# Patient Record
Sex: Male | Born: 1935
Health system: Southern US, Community
[De-identification: ages and names within clinical notes are randomized; demographics above are authoritative.]

## PROBLEM LIST (undated history)

## (undated) DIAGNOSIS — E785 Hyperlipidemia, unspecified: Secondary | ICD-10-CM

## (undated) DIAGNOSIS — I2510 Atherosclerotic heart disease of native coronary artery without angina pectoris: Secondary | ICD-10-CM

## (undated) DIAGNOSIS — I509 Heart failure, unspecified: Secondary | ICD-10-CM

## (undated) DIAGNOSIS — I714 Abdominal aortic aneurysm, without rupture, unspecified: Secondary | ICD-10-CM

## (undated) DIAGNOSIS — Z951 Presence of aortocoronary bypass graft: Secondary | ICD-10-CM

## (undated) DIAGNOSIS — I110 Hypertensive heart disease with heart failure: Principal | ICD-10-CM

## (undated) DIAGNOSIS — R351 Nocturia: Secondary | ICD-10-CM

## (undated) DIAGNOSIS — I429 Cardiomyopathy, unspecified: Secondary | ICD-10-CM

## (undated) DIAGNOSIS — D638 Anemia in other chronic diseases classified elsewhere: Secondary | ICD-10-CM

## (undated) DIAGNOSIS — I10 Essential (primary) hypertension: Secondary | ICD-10-CM

## (undated) DIAGNOSIS — I350 Nonrheumatic aortic (valve) stenosis: Secondary | ICD-10-CM

## (undated) DIAGNOSIS — N184 Chronic kidney disease, stage 4 (severe): Secondary | ICD-10-CM

## (undated) DIAGNOSIS — I519 Heart disease, unspecified: Secondary | ICD-10-CM

## (undated) DIAGNOSIS — N400 Enlarged prostate without lower urinary tract symptoms: Secondary | ICD-10-CM

## (undated) DIAGNOSIS — N3941 Urge incontinence: Secondary | ICD-10-CM

## (undated) DIAGNOSIS — N2581 Secondary hyperparathyroidism of renal origin: Secondary | ICD-10-CM

## (undated) DIAGNOSIS — T82330D Leakage of aortic (bifurcation) graft (replacement), subsequent encounter: Secondary | ICD-10-CM

## (undated) HISTORY — DX: Essential (primary) hypertension: I10

## (undated) HISTORY — DX: Presence of aortocoronary bypass graft: Z95.1

## (undated) HISTORY — DX: Hyperlipidemia, unspecified: E78.5

## (undated) HISTORY — DX: Nocturia: R35.1

## (undated) HISTORY — DX: Nonrheumatic aortic (valve) stenosis: I35.0

## (undated) HISTORY — DX: Atherosclerotic heart disease of native coronary artery without angina pectoris: I25.10

## (undated) HISTORY — DX: Heart failure, unspecified: I50.9

## (undated) HISTORY — DX: Benign prostatic hyperplasia without lower urinary tract symptoms: N40.0

## (undated) HISTORY — DX: Leakage of aortic (bifurcation) graft (replacement), subsequent encounter: T82.330D

## (undated) HISTORY — DX: Abdominal aortic aneurysm, without rupture: I71.4

## (undated) HISTORY — DX: Cardiomyopathy, unspecified: I42.9

## (undated) HISTORY — DX: Secondary hyperparathyroidism of renal origin: N25.81

## (undated) HISTORY — DX: Abdominal aortic aneurysm, without rupture, unspecified: I71.40

## (undated) HISTORY — DX: Chronic kidney disease, stage 4 (severe): N18.4

## (undated) HISTORY — PX: CARDIAC CATHETERIZATION: SHX172

## (undated) HISTORY — PX: CORONARY ARTERY BYPASS GRAFT: SHX141

## (undated) HISTORY — PX: BACK SURGERY: SHX140

## (undated) HISTORY — DX: Hypertensive heart disease with heart failure: I11.0

## (undated) HISTORY — PX: REPLACEMENT TOTAL KNEE BILATERAL: SUR1225

## (undated) HISTORY — DX: Urge incontinence: N39.41

## (undated) HISTORY — DX: Anemia in other chronic diseases classified elsewhere: D63.8

## (undated) HISTORY — DX: Heart disease, unspecified: I51.9

---

## 1998-10-07 HISTORY — PX: REPLACEMENT TOTAL KNEE BILATERAL: SUR1225

## 2000-10-07 HISTORY — PX: CORONARY ARTERY BYPASS GRAFT: SHX141

## 2004-10-07 HISTORY — PX: BACK SURGERY: SHX140

## 2005-07-25 ENCOUNTER — Inpatient Hospital Stay (HOSPITAL_COMMUNITY): Admission: RE | Admit: 2005-07-25 | Discharge: 2005-08-01 | Payer: Self-pay | Admitting: Neurosurgery

## 2005-07-29 ENCOUNTER — Ambulatory Visit: Payer: Self-pay | Admitting: Internal Medicine

## 2005-08-23 ENCOUNTER — Ambulatory Visit (HOSPITAL_COMMUNITY): Admission: RE | Admit: 2005-08-23 | Discharge: 2005-08-23 | Payer: Self-pay | Admitting: Neurosurgery

## 2006-08-20 IMAGING — CR DG CHEST 2V
2 series · 2 of 2 positions shown · non-contrast
Comparison: 07/23/05.

CLINICAL DATA: Stenosis.  Cough.  
 CHEST ? 2 VIEW:

[w chest pa]
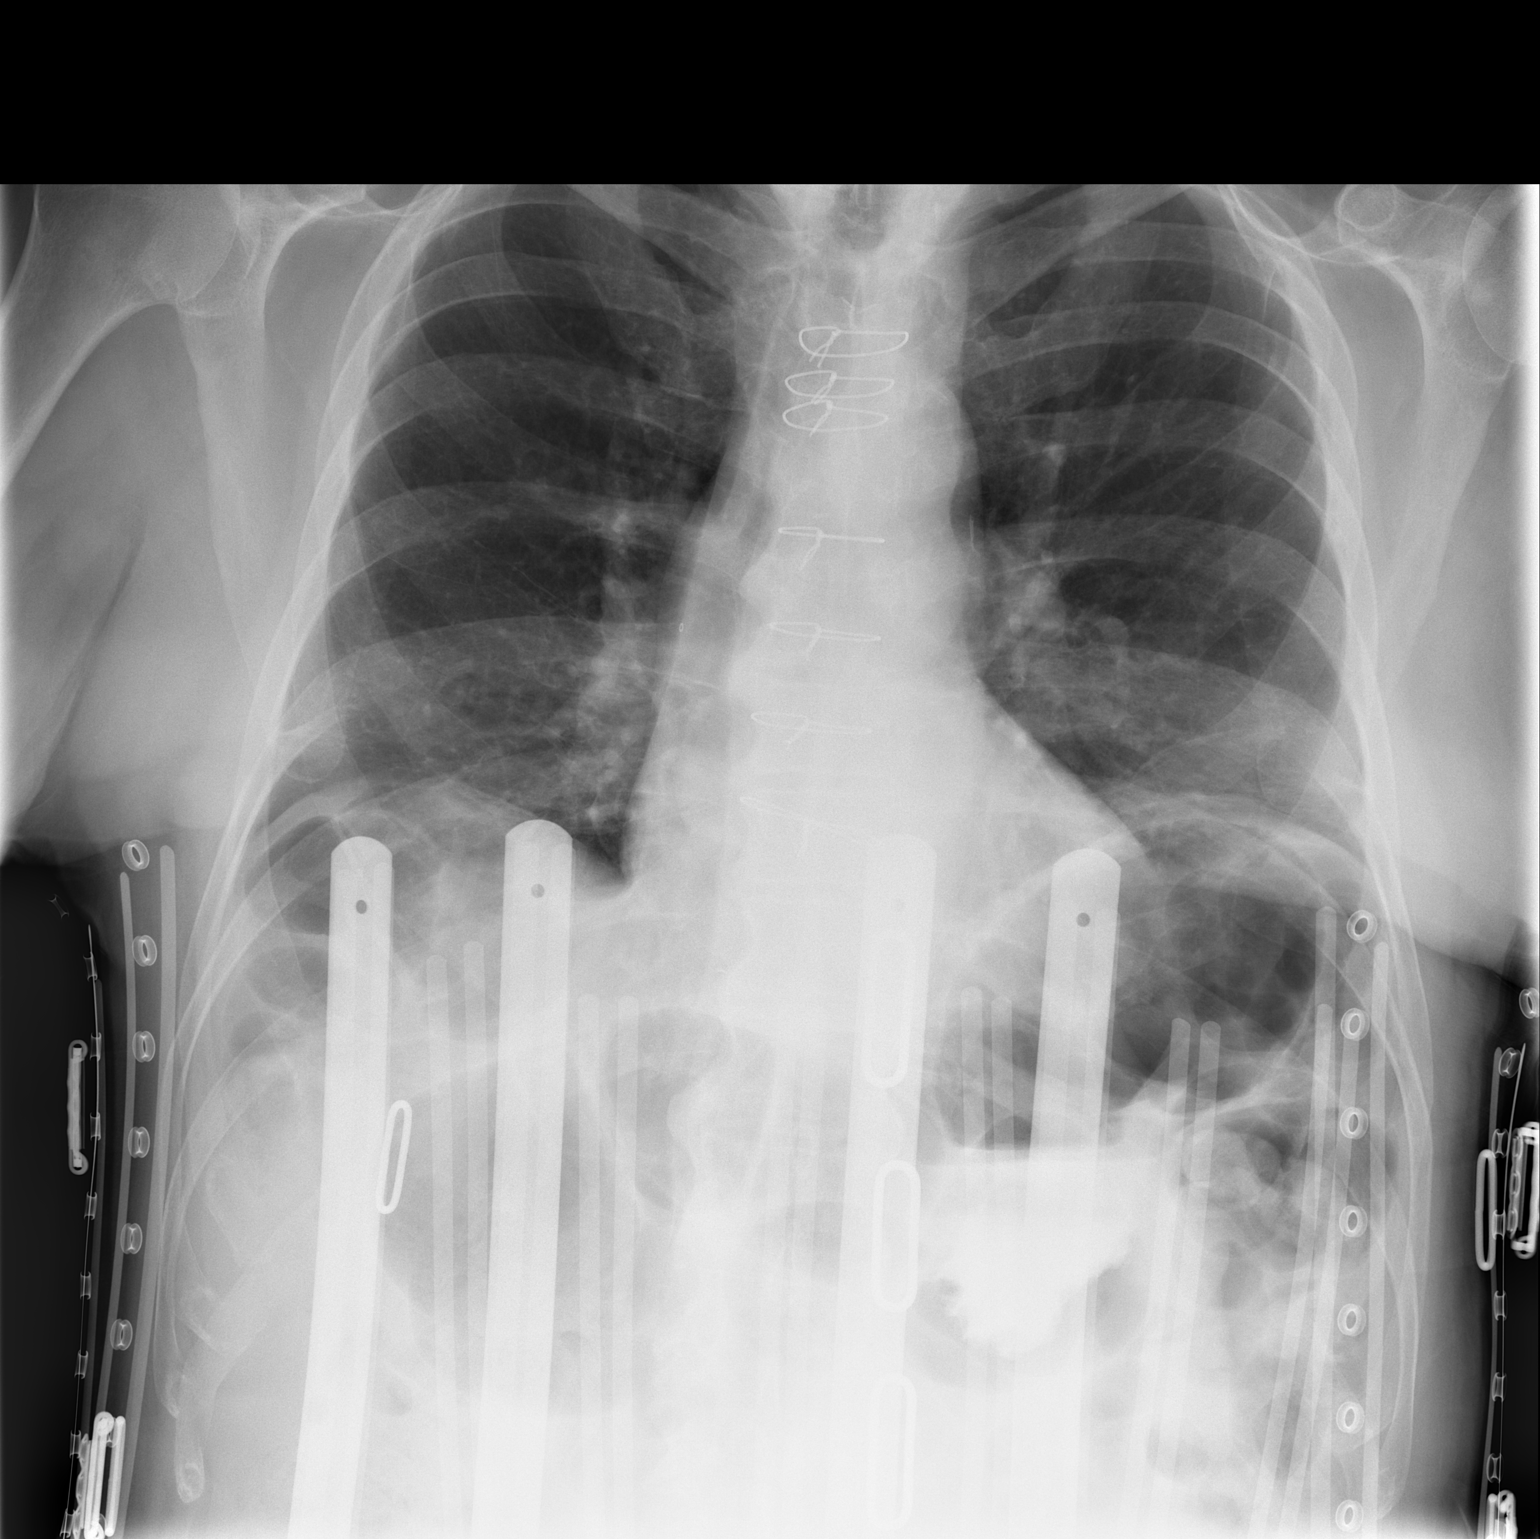

[w chest lat]
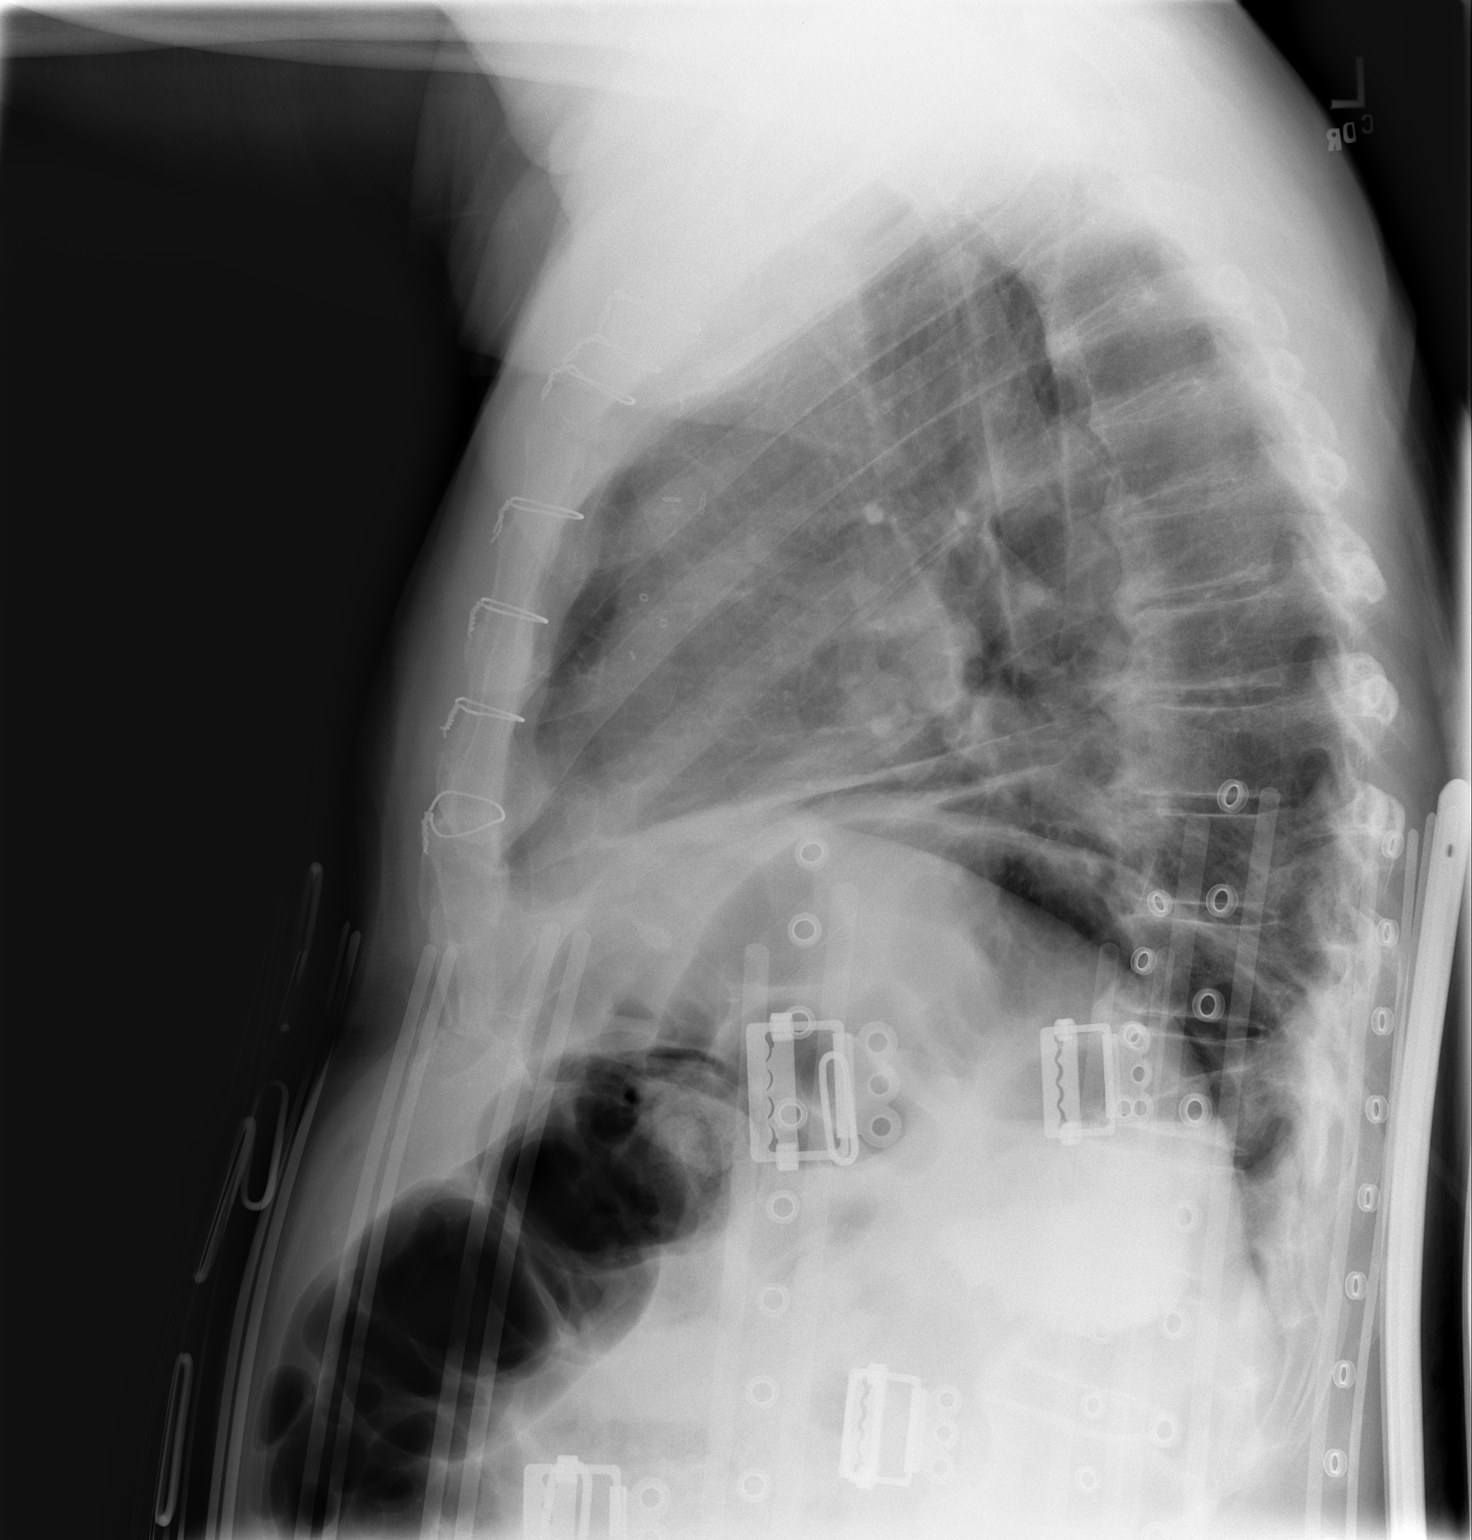

[2 of 2 positions shown; findings below may reference images not displayed]

FINDINGS: Peribronchial thickening in the lower lung zones compatible with chronic changes.  Mild subsegmental atelectasis may be present at the lung bases.  No infiltrative-like density.  Unremarkable cardiomediastinal silhouette.  Sternal wire sutures.
IMPRESSION: Chronic bronchitic-like markings at the bases in addition to possible mild subsegmental atelectasis.

## 2007-10-08 HISTORY — PX: REPLACEMENT TOTAL KNEE: SUR1224

## 2007-11-02 ENCOUNTER — Inpatient Hospital Stay (HOSPITAL_COMMUNITY): Admission: RE | Admit: 2007-11-02 | Discharge: 2007-11-06 | Payer: Self-pay | Admitting: Orthopedic Surgery

## 2007-11-04 ENCOUNTER — Encounter (INDEPENDENT_AMBULATORY_CARE_PROVIDER_SITE_OTHER): Payer: Self-pay | Admitting: Orthopedic Surgery

## 2007-11-04 ENCOUNTER — Ambulatory Visit: Payer: Self-pay | Admitting: Vascular Surgery

## 2010-10-07 HISTORY — PX: ARTERIAL ANEURYSM REPAIR: SHX556

## 2011-01-23 ENCOUNTER — Other Ambulatory Visit: Payer: Self-pay | Admitting: Oncology

## 2011-01-23 ENCOUNTER — Encounter (HOSPITAL_BASED_OUTPATIENT_CLINIC_OR_DEPARTMENT_OTHER): Payer: Medicare Other | Admitting: Oncology

## 2011-01-23 ENCOUNTER — Ambulatory Visit (HOSPITAL_COMMUNITY)
Admission: RE | Admit: 2011-01-23 | Discharge: 2011-01-23 | Disposition: A | Discharge status: Home / Self Care | Payer: Medicare Other | Source: Ambulatory Visit | Attending: Oncology | Admitting: Oncology

## 2011-01-23 DIAGNOSIS — Z96659 Presence of unspecified artificial knee joint: Secondary | ICD-10-CM | POA: Insufficient documentation

## 2011-01-23 DIAGNOSIS — E785 Hyperlipidemia, unspecified: Secondary | ICD-10-CM

## 2011-01-23 DIAGNOSIS — D472 Monoclonal gammopathy: Secondary | ICD-10-CM

## 2011-01-23 DIAGNOSIS — Z0389 Encounter for observation for other suspected diseases and conditions ruled out: Secondary | ICD-10-CM | POA: Insufficient documentation

## 2011-01-23 DIAGNOSIS — C9000 Multiple myeloma not having achieved remission: Secondary | ICD-10-CM

## 2011-01-23 DIAGNOSIS — I2510 Atherosclerotic heart disease of native coronary artery without angina pectoris: Secondary | ICD-10-CM

## 2011-01-23 DIAGNOSIS — Z981 Arthrodesis status: Secondary | ICD-10-CM | POA: Insufficient documentation

## 2011-01-23 DIAGNOSIS — N400 Enlarged prostate without lower urinary tract symptoms: Secondary | ICD-10-CM

## 2011-01-23 LAB — CBC WITH DIFFERENTIAL/PLATELET
BASO%: 0.3 % (ref 0.0–2.0)
Basophils Absolute: 0 10*3/uL (ref 0.0–0.1)
EOS%: 1.4 % (ref 0.0–7.0)
HCT: 38.1 % — ABNORMAL LOW (ref 38.4–49.9)
HGB: 12.9 g/dL — ABNORMAL LOW (ref 13.0–17.1)
LYMPH%: 18.4 % (ref 14.0–49.0)
MCH: 36.2 pg — ABNORMAL HIGH (ref 27.2–33.4)
MCHC: 33.9 g/dL (ref 32.0–36.0)
MCV: 106.7 fL — ABNORMAL HIGH (ref 79.3–98.0)
MONO#: 1.4 10*3/uL — ABNORMAL HIGH (ref 0.1–0.9)
NEUT#: 4.7 10*3/uL (ref 1.5–6.5)
NEUT%: 61.5 % (ref 39.0–75.0)
Platelets: 173 10*3/uL (ref 140–400)
RBC: 3.57 10*6/uL — ABNORMAL LOW (ref 4.20–5.82)
RDW: 13.9 % (ref 11.0–14.6)
WBC: 7.6 10*3/uL (ref 4.0–10.3)
lymph#: 1.4 10*3/uL (ref 0.9–3.3)

## 2011-01-25 LAB — COMPREHENSIVE METABOLIC PANEL
ALT: 10 U/L (ref 0–53)
AST: 23 U/L (ref 0–37)
Albumin: 3.8 g/dL (ref 3.5–5.2)
Alkaline Phosphatase: 48 U/L (ref 39–117)
BUN: 20 mg/dL (ref 6–23)
Calcium: 9.2 mg/dL (ref 8.4–10.5)
Chloride: 106 mEq/L (ref 96–112)
Creatinine, Ser: 1.44 mg/dL (ref 0.40–1.50)
Potassium: 4.2 mEq/L (ref 3.5–5.3)
Sodium: 133 mEq/L — ABNORMAL LOW (ref 135–145)

## 2011-01-25 LAB — KAPPA/LAMBDA LIGHT CHAINS
Kappa:Lambda Ratio: 0.99 (ref 0.26–1.65)
Lambda Free Lght Chn: 2.49 mg/dL (ref 0.57–2.63)

## 2011-01-25 LAB — SPEP & IFE WITH QIG
Albumin ELP: 55.6 % — ABNORMAL LOW (ref 55.8–66.1)
Alpha-1-Globulin: 5 % — ABNORMAL HIGH (ref 2.9–4.9)
Alpha-2-Globulin: 12.2 % — ABNORMAL HIGH (ref 7.1–11.8)
Gamma Globulin: 15 % (ref 11.1–18.8)
IgA: 346 mg/dL (ref 68–378)
IgG (Immunoglobin G), Serum: 926 mg/dL (ref 694–1618)
IgM, Serum: 32 mg/dL — ABNORMAL LOW (ref 60–263)
Total Protein, Serum Electrophoresis: 6.3 g/dL (ref 6.0–8.3)

## 2011-02-19 NOTE — Op Note (Signed)
NAME:  Harry Klein, Harry Klein NO.:  [id number]   MEDICAL RECORD NO.:  [id number]          PATIENT TYPE:  INP   LOCATION:  5037                         FACILITY:  [facility]   PHYSICIAN:  Estill Bamberg. Ronnie Derby, M.D. DATE OF BIRTH:  Feb 18, 1922   DATE OF PROCEDURE:  11/05/2007  DATE OF DISCHARGE:                               OPERATIVE REPORT   SURGEON:  Estill Bamberg. Ronnie Derby, M.D.   ASSISTANTAaron Edelman D. Petrarca, P.A.-C.   ANESTHESIA:  General.   PREOPERATIVE DIAGNOSIS:  Left knee osteoarthritis.   POSTOPERATIVE DIAGNOSIS:  Left knee osteoarthritis.   PROCEDURE:  Left total knee arthroplasty.   INDICATIONS FOR PROCEDURE:  The patient is an 77 year old white male  with failure of conservative measures for osteoarthritis of the left  knee.  Informed consent was obtained.   DESCRIPTION OF PROCEDURE:  The patient was placed supine and  administered general anesthesia. The left leg was prepped and draped in  the usual sterile fashion.  A straight incision was made with a #10  blade approximately 6-7 inches in length.  I used fresh 10 blade to make  a medial parapatellar arthrotomy to perform a synovectomy.  I then  elevated the deep MCL off of the medial crest of the tibia.  This was a  varus knee.  I did have to do some releasing of the MCL.  I everted the  patella and measured 26 mm thick.  I reamed down 9 mm and used a 35-mm  template to drill the lug holes.  The prosthetic trial in place  recreated the 25-mm thickness.  I then went into flexion.  I used  extramedullary alignment system on the tibia to make a perpendicular cut  to the anatomic axis of the tibia.  Used the intramedullary drill guide  on the femur and used the intramedullary guide set on 6 degree valgus  and made the distal femoral cut with a sagittal saw.  I then marked out  the epicondylar axis.  Posterior condylar angle measured 3 degrees.  I  then sized to size F pinned through the 3 degree external rotation  holes.  At this point, I placed a lamina spreader in the kneed and  removed the medial and lateral menisci, posterior condylar osteophytes  and ACL and PCL.  I then placed the 10- and 12-mm spacer block in the  knee and actually ended up going up to a 14 until I achieved balance.  This was because I released some of the medial ligaments.  I then  finished the femur with a size F finishing block, tibia with size 6  tibial tray, drilled the keep.  I then trialed a size F femur, size 6  tibia, size 14 insert, size 35 patella, had good flexion/extension gap  balance dropping the angle back to 125 degrees.  I then removed the  trial components and copiously irrigated.  I then cemented in all the  components and removed excess cement and allowed the cement to harden  with the leg in extension.  I left a Hemovac coming out superolaterally  and deep to the arthrotomy, pain catheter coming out supermedial and  superficial to the arthrotomy.  I obtained hemostasis after the  tourniquet was let down and copiously irrigated once again.  I then  closed the arthrotomy with figure-of-eight #1 Vicryl sutures.  After the  arthrotomy closure, closed deep soft tissues with buried 0 Vicryl  sutures, subcuticular 2-0 Vicryl running stitch, and skin staples.  Dressed with Xeroform dressing sponges, sterile Webril and TED stocking.   COMPLICATIONS:  None.   DRAINS:  One Hemovac, one pain catheter.   ESTIMATED BLOOD LOSS:  300 mL.           ______________________________  Estill Bamberg. Ronnie Derby, M.D.     SDL/MEDQ  D:  11/05/2007  T:  11/06/2007  Job:  [id number]

## 2011-02-22 NOTE — Discharge Summary (Signed)
NAME:  Harry Klein, Harry Klein NO.:  [id number]   MEDICAL RECORD NO.:  [id number]          PATIENT TYPE:  INP   LOCATION:  3002                         FACILITY:  [facility]   PHYSICIAN:  Ophelia Charter, M.D.DATE OF BIRTH:  1922-07-18   DATE OF ADMISSION:  07/25/2005  DATE OF DISCHARGE:  08/01/2005                                 DISCHARGE SUMMARY   BRIEF HISTORY:  The patient is an 77 year old white male who suffered from  severe back and leg numbness and weakness to the point where he could barely  ambulate. He was worked up on a lumbar MRI which demonstrated severe spinal  stenosis of L4-5 with moderate spinal stenosis of L3-4 and L5-S1.  I  discussed the risks of treatment options with the patient including surgery.  The patient has weighed the risks, benefits, and alternatives of surgery and  decided to proceed with decompressive laminectomy as well as an L4-5 fusion.   For further details of this admission, please refer to typed history and  physical.   HOSPITAL COURSE:  I admitted the patient to [facility] on July 25, 2005, and on the day of admission I performed a lumbar decompression and  fusion. The surgery went well (for full details of this operation please  refer to operative note).   POSTOPERATIVE COURSE:  The patient was initially monitored in the ICU  because of his advanced age. He did well and was transferred to the  neurosurgical general floor. On postoperative day #1, the patient did have  some urinary retention and was treated with I&O catheterization and Flomax.  The patient's main complaint, however, was that he had a lot of trouble  swallowing. Because of this, we had anesthesiologist come back and see the  patient. They made some recommendations. We had the speech therapist see the  patient as well and they did some therapy. The patient continued to have  trouble swallowing and we eventually had GI see the patient. He was seen by  Dr. Carlean Purl. His dysphagia was attributed to pharyngeal trauma secondary to  intubation and was assumed it would improve with time, which it did. The  patient was kept n.p.o. for some time and eventually had his swallowing  evaluation redone which demonstrated that he could begin eating a dysphagia  II diet with nectar thin liquids. By August 01, 2005, the patient was  afebrile, his vital signs were stable, he was swallowing adequately, and he  was requesting discharge. We therefore discharged him home.   DISCHARGE INSTRUCTIONS:  The patient was given discharge instructions,  instructed to follow up with me in 3-4 weeks, instructed on his diet by the  speech therapist.   DISCHARGE PRESCRIPTIONS:  1.  Tylox, #50, one to two p.o. q.4h. p.r.n. pain.  2.  Valium 2 mg, #50, one p.o. q.6h. muscle spasms.   FINAL DIAGNOSES:  1.  L3-4, L4-5, and L5-S1 degenerative disk disease.  2.  Spinal stenosis.  3.  Lumbar radiculopathy.  4.  Lumbago.  5.  Urinary retention.  6.  Dysphagia.  7.  Gastroesophageal  reflux disease.  8.  Benign prostatic hypertrophy.  9.  Hypercholesterolemia.  10. First-degree AV block.   PROCEDURE PERFORMED:  Decompressive laminectomy L4-5 with bilateral  laminotomies at L3; L4-5 posterior lumbar interbody fusion; placement of  bilateral L4-5 interbody prosthesis (Capstone PEEK cages); posterior  nonsegmental instrumentation with Legacy titanium pedicle screws and rods;  L4-5 posterolateral arthrodesis with local morcellized autograft bone and  VITOSS bone graft extender.      Ophelia Charter, M.D.  Electronically Signed     JDJ/MEDQ  D:  09/19/2005  T:  09/22/2005  Job:  [id number]

## 2011-02-22 NOTE — Consult Note (Signed)
NAME:  Harry Klein, Harry Klein NO.:  [id number]   MEDICAL RECORD NO.:  [id number]          PATIENT TYPE:  INP   LOCATION:  3002                         FACILITY:  [facility]   PHYSICIAN:  Gatha Mayer, M.D. LHCDATE OF BIRTH:  03/16/1922   DATE OF CONSULTATION:  07/29/2005  DATE OF DISCHARGE:                                   CONSULTATION   REASON FOR CONSULTATION:  Dysphagia.   ASSESSMENT:  Dysphagia.  Acute onset after endotracheal intubation.  It is  temporally associated with that.  He is tender in his neck and pharyngeal  area and has pain there as well when he swallows, and his voice is hoarse.  I think he has had some sort of trauma from his endotracheal intubation with  swelling.  The cricopharyngeus is spastic on a modified barium swallow.   PLAN:  Neck CT.  MRI would be better, but he has had fresh metal in his  lumbar spine (he had lumbar stenosis surgery with laminectomy,  decompression, and spinal fusion on July 25, 2005).  Further plans  pending that.  May need steroids, may need ENT evaluation.   HISTORY OF PRESENT ILLNESS:  The patient is a pleasant 77 year old white man  who had chronic low back pain.  He underwent surgery, as described above, on  July 25, 2005.  Almost since waking up, he has been sore in his throat,  having pain with trying to swallow and has been unable to swallow basically.  Prior to that, he had some pill dysphagia but never had problems like this  before.  Speech pathology study demonstrated a prominent cricopharyngeus  muscle that was spastic apparently and kicked back the food and liquid  bolus.  His GI history is notable for a history of GI bleeding.  In July of  2006, he had EGD, colonoscopy, and a capsule endoscopy by Dr. Lyndel Safe in  [place], and I think it was thought that he had hemorrhoids causing  bleeding.  A week prior to admission, he was started on iron for some  anemia.  Records on the chart show his iron  saturation was 14, ferritin was  14 as well on July 15, 2005.  Dr. Lin Landsman at [facility]  is his primary care physician.  While here, his hemoglobin was 9.9.  His  hemoglobin was 10.6 on July 11, 2005.   ALLERGIES:  NO KNOWN DRUG ALLERGIES.   MEDICATIONS:  1.  Tenormin.  2.  Proscar.  3.  Psyllium.  4.  Colace.  5.  Niacin.  6.  Zocor.  7.  Iron sulfate.  8.  Protonix 40 mg daily.  9.  Flomax.   PAST MEDICAL HISTORY:  1.  Lumbar stenosis.  2.  Benign prostatic hypertrophy.  3.  Anemia thought to be due to hemorrhoids as best we know.  4.  Coronary artery disease with history of previous stent, coronary artery      bypass grafting in 2002 by Dr. Alyse Low in [place].  5.  History of facial skin cancer.  6.  Status post right total  knee replacement in 2000.  7.  Dyslipidemia.  8.  Reflux disease.  9.  Atelectasis on preoperative chest x-ray.   SOCIAL HISTORY:  His daughter works for hospice in [place].  He  lives with his wife in [place].  No tobacco or alcohol.   FAMILY HISTORY:  No colon cancer.   REVIEW OF SYSTEMS:  Back pain, postoperative at this point.  Inner ear  problems and dizziness at times.  Chronic rash on abdominal wall.  All other  systems appear negative at this time other than those things mentioned  above.   PHYSICAL EXAMINATION:  GENERAL:  Elderly white man in no acute distress at  this time.  Pleasant.  VITAL SIGNS:  Temperature 98.4, pulse 68, respirations 18, blood pressure  120/72.  HEENT:  Eyes anicteric.  Mouth free of lesions.  I do not see the uvula  well.  The tongue looks normal.  NECK:  Supple.  He is tender over the neck.  Bilateral in the submandibular  area, the thyroid cartilage is palpable, but I do not detect a mass.  There  is some fullness, left greater than right, in the neck.  LUNGS:  Crackles at the bases, improved with respiration.  HEART:  S1 and S2.  No murmurs, rubs, or gallops.  ABDOMEN:  Soft,  nontender.  No organomegaly or mass.  EXTREMITIES:  No edema.  NEUROLOGIC:  Appropriate, alert and oriented x3.   LABORATORY DATA:  As above.  BMET looks normal.   I appreciate the opportunity to care for this patient.      Gatha Mayer, M.D. [facility]  Electronically Signed     CEG/MEDQ  D:  07/29/2005  T:  07/30/2005  Job:  [id number]   cc:   Ophelia Charter, M.D.  Fax: [phone]   Jackquline Denmark  FaxYF:[phone]   Lovette Cliche II, M.D.  Fax: [phone]

## 2011-02-22 NOTE — Op Note (Signed)
NAME:  Harry Klein, Harry Klein                ACCOUNT NO.:  [id number]   MEDICAL RECORD NO.:  [id number]          PATIENT TYPE:  INP   LOCATION:  2899                         FACILITY:  [facility]   PHYSICIAN:  Ophelia Charter, M.D.DATE OF BIRTH:  [birth year]   DATE OF PROCEDURE:  07/25/2005  DATE OF DISCHARGE:                                 OPERATIVE REPORT   PREOPERATIVE DIAGNOSIS:  L3-4, 4-5 and 5-1 degenerative disk disease, spinal  stenosis, lumbar radiculopathy with lumbago.   POSTOPERATIVE DIAGNOSIS:  L3-4, 4-5 and 5-1 degenerative disk disease,  spinal stenosis, lumbar radiculopathy with lumbago.   OPERATION PERFORMED:  Decompressive laminectomy at L4 and L5 with bilateral  laminotomies at L3; L4-5 posterior lumbar interbody fusion, placement of  bilateral interbody prostheses (Capstone PEEK cages), posterior nonsegmental  instrumentation with Legacy titanium pedicle screws and rods and L4-5  posterolateral arthrodesis with local morcellized autograft and Vitoss bone  graft extender.   SURGEON:  Ophelia Charter, M.D.   ASSISTANT:  Hosie Spangle, M.D.   ANESTHESIA:  General endotracheal.   ESTIMATED BLOOD LOSS:  200 mL.   SPECIMENS:  None.   DRAINS:  None.   COMPLICATIONS:  None.   INDICATIONS FOR PROCEDURE:  The patient is an 77 year old white male who has  suffered from severe back and leg pain numbness and weakness to the point  where he can barely ambulate.  He was worked with a lumbar MRI which  demonstrated severe spinal stenosis at L4-5 with moderate stenosis at L3-4  and 5-1.  I discussed the various treatment options with the patient  including surgery.  The patient has weighed the risks, benefits and  alternatives to surgery and decided to proceed with a decompressive  laminectomy as well as a 4-5 fusion.   DESCRIPTION OF PROCEDURE:  The patient was brought to the operating room by  the anesthesia team.  General endotracheal anesthesia was induced.  The  patient was then turned to the prone position on the Wilson frame.  His  lumbosacral region was then shaved and prepared with Betadine scrub and  Betadine solution and sterile drapes were applied.  I then injected the area  to be incised with Marcaine with epinephrine solution.  I used a scalpel to  make a linear midline incision over the L3-4, 4-5 and 5-1 interspaces.  I  used electrocautery to perform a subperiosteal dissection exposing the  bilateral spinous processes and laminae of L3, 4, 5, and S1.  We obtained an  intraoperative radiograph to confirm our location.  I inserted the  Versatract retractor for exposure.  We began by incising the interspinous  ligament at L3-4, 4-5 and 5-1.  We used Leksell rongeur to remove the  spinous process of L4 and 5.  We saved this bone and later cleared it of  soft tissue and morcellized it to be used in the fusion process.  We  performed bilateral laminotomies at L3, 4, and 5.  We completed the  laminectomy at L4 and 5 using Kerrison punches and removed the ligamentum  flavum at L5-S1 and 4-5 and L3-4  widening the laminotomies at L3 as well.  We performed foraminotomies about the bilateral L4, 5, and S1 nerve roots.  There was severe facet arthropathy at L5-S1 on the right and severe stenosis  at L4-5 as well.  This completed the decompression.   We now turned our attention to the arthrodesis.  We incised the L4-5  intervertebral disk and then performed aggressive diskectomy from the left  side.  We removed the left L4-5 facet and this gave [place] good lateral  transforaminal exposure.  We then cleared off all soft tissue from the L4-5  disk space and then we obtained a Capstone PEEK cage which measured 8 x 31  mm.  It was filled with local autograft gone and Vitoss.  We then inserted  it at the L4-5 interspace and turned it side ways and impacted it anteriorly  ie we performed a TLIF.  We of course retracted the neural structures out of  harm's way  prior to placing this cage. We then filled posteriorly to the  cage with local autograft bone and Vitoss completing the posterior lumbar  interbody fusion.   We now turned attention to instrumentation.  Under fluoroscopic guidance we  cannulated the bilateral L4 and L5 pedicles with a bone probe.  We tapped  the pedicles with a 5.5 tap and placed a 6.5 x 55 mm pedicle screw  bilaterally at L4 and 6.5 x 55 screwon the other side at L5.  We did on the  right side the we did fracture the pedicle in placing the pedicle screw, and  we removed the fractured part of the pedicle medially to make more room for  the exiting L5 nerve root.  After doing this, there was no compression of  the L5 nerve root.  We then connected the unilateral pedicle screws with a  lordotic rod.  We secured the rod in place with the caps.  We now turned  attention to posterolateral arthrodesis.  We used a high speed drill to  decorticate the remainder of the right L4-5 facet joint and laid a  combination of autograft bone and Vitoss over the decorticated  posterolateral structures completing the posterolateral arthrodesis.   We obtained stringent hemostasis using bipolar electrocautery.  We inspected  the thecal sac and the bilateral L4, 5 and S1 nerve roots and noted that  they were well decompressed all the way up through the neural foramina.  We  then copiously irrigated the wound out with bacitracin solution.  I then  removed the solution, removed the Versatract retractor and then  reapproximated the patient's thoracolumbar fascia with interrupted #1 Vicryl  sutures, subcutaneous tissue with interrupted 2-0 Vicryl suture and the skin  with Steri-Strips and benzoin. The wound was then coated with bacitracin  ointment, sterile dressing was applied, the drapes were removed.  The  patient was subsequently returned to supine position where he was extubated by the anesthesia team and transported to the post anesthesia  care unit in  stable condition.  All sponge, needle and instrument counts were correct at  the end of the case.      Ophelia Charter, M.D.  Electronically Signed     JDJ/MEDQ  D:  07/26/2005  T:  07/26/2005  Job:  [id number]

## 2011-02-22 NOTE — Discharge Summary (Signed)
NAME:  OTHELLO, ANDRADA NO.:  [id number]   MEDICAL RECORD NO.:  [id number]          PATIENT TYPE:  INP   LOCATION:  5037                         FACILITY:  [facility]   PHYSICIAN:  Estill Bamberg. Ronnie Derby, M.D. DATE OF BIRTH:  11/10/1921   DATE OF ADMISSION:  11/02/2007  DATE OF DISCHARGE:  11/06/2007                               DISCHARGE SUMMARY   ADMISSION DIAGNOSIS:  Osteoarthritis left knee.   DISCHARGE DIAGNOSES:  1. Osteoarthritis left knee.  2. History of coronary artery disease/myocardial infarction/coronary      artery bypass grafting.  3. Abdominal aortic aneurysm.  4. Acute blood-loss anemia.  5. Heparin-induced thrombocytopenia.  6. Hyperosmolality.   PROCEDURE:  Left total knee arthroplasty.   HISTORY:  Harry Klein is an 77 year old male with intermittent left  knee pain, that of a severe, sharp pain associated with weakness.  Pain  is worsening and affecting his ADLs.  He denies any nighttime pain.  Using Darvocet for pain.  Radiographic end-stage OA of the left knee.  Indicated for left total knee arthroplasty.   HOSPITAL COURSE:  This 77 year old male admitted November 02, 2007.  After appropriate laboratory studies were obtained and 1 gram Ancef IV  on-call to the operating room he was taken to the operating room where  he underwent a left total knee arthroplasty.  He tolerated the procedure  well.  PCA Dilaudid pump in a reduced dose was started.  He was  continued on Ancef 1 gram IV q.6h. x3 doses.  Celebrex 400 mg p.o. in  PACU and then 200 b.i.d. was begun.  Lovenox 30 mg subcu q.12h. was  started on January 27 at 8 a.m.  Consults to PT, OT and care management  were made.  CPM 0-90 degrees for 6-8 hours per day.  He was allowed to  weight-bear as tolerated.  He was allowed out of bed to chair the  following day.  He was weaned off of his O2.  Speech therapy was  consulted for swallow study at the request of the family.  On January 28  his  dressing was changed, his Foley was discontinued.  Heparin-induced  thrombocytopenia screen was ordered and was noted to be positive.  He  was then switched to Arixtra.  Dopplers were ordered of his left lower  leg to rule out DVT.  B12 and folate levels were ordered also.  On  January 29 he was typed and crossed for 2 units of packed cells.  His  Arixtra was held on January 29.  All stools were guaiaced.  He was  placed on Trinsicon one p.o. b.i.d.  On January 30 he was transfused one  more unit of packed cells and he was then discharged after the unit to  home.  Doppler studies showed no obvious evidence of DVT, superficial  thrombosis, or Baker's cyst.  Laboratory studies with hemoglobin of  14.0; hematocrit 40.8%; white count 8300; platelets 172,000.  His  platelets dropped to 101,000.  Discharge hemoglobin 8.4; hematocrit  4.1%; white count 9.6; and platelets were 115,000.  His MCV was 102.4.  Pro time preoperatively 13.7, INR of 1.0, PT 29.  Heparin-induced  platelet aggregation study was negative.  However, the HIT rapid screen  was positive.  HIT antibody detection ELISA was negative.  Preoperative  sodium 139, potassium 4.4, chloride 104, CO2 29, glucose 99, BUN 15,  creatinine 1.36.  He had sodium down to 132 and his potassium 3.4 4  during his hospital stay.  Creatinine rose to a maximum of 2.05.  Discharge sodium 138, potassium 3.6, chloride 108, CO2 25, glucose 107,  BUN 26, creatinine 1.55.  GFR preoperatively was 50.  GFR at discharge  was 43.  Preoperative total protein 6.7, albumin 3.9, AST 20, ALT 10,  ALP 60, total bilirubin 0.6.  B12 was 392.  Folate was 1066 - quite  elevated.  Urinalysis was benign for a voided urine.  Blood type was O  negative, antibody screen negative.  Transfused 3 units of packed cells  during his hospital course.  Urine culture showed no growth.   DISCHARGE INSTRUCTIONS:  He will follow blue instruction sheet.  Change  his dressing daily.   There will be no restrictions on his diet.  Increase his activity as tolerated.  Use his walker and ambulate  weightbearing as tolerated.  May shower Friday.  No lifting or driving  for 6 weeks.  CPM 0-90 degrees 6-8 hours per day.   Prescriptions for:  1. Percocet 5/325 one to two tablets every 4 hours as needed for pain.  2. Robaxin 500 mg one tablet every 6 hours as needed for spasms.  3. Arixtra 2.5 mg subcu injected as instructed at 8 a.m. daily.  Last      dose November 13, 2007, and then begin aspirin 81 mg on November 14, 2007.  4. Take an iron supplement two times a day for 1 month.   Discharged in improved condition.      Mike Craze Petrarca, P.A.-C.    ______________________________  Estill Bamberg. Ronnie Derby, M.D.    BDP/MEDQ  D:  12/04/2007  T:  12/05/2007  Job:  [id number]

## 2011-06-27 LAB — BASIC METABOLIC PANEL
BUN: 25 — ABNORMAL HIGH
CO2: 25
Calcium: 8.2 — ABNORMAL LOW
Calcium: 8.3 — ABNORMAL LOW
Chloride: 100
Chloride: 100
Creatinine, Ser: 2.05 — ABNORMAL HIGH
GFR calc Af Amer: 52 — ABNORMAL LOW
GFR calc Af Amer: >60
GFR calc non Af Amer: 42 — ABNORMAL LOW
GFR calc non Af Amer: 43 — ABNORMAL LOW
GFR calc non Af Amer: 55 — ABNORMAL LOW
Glucose, Bld: 107 — ABNORMAL HIGH
Glucose, Bld: 128 — ABNORMAL HIGH
Glucose, Bld: 131 — ABNORMAL HIGH
Glucose, Bld: 135 — ABNORMAL HIGH
Glucose, Bld: 138 — ABNORMAL HIGH
Potassium: 3.6
Potassium: 3.8
Potassium: 4
Sodium: 133 — ABNORMAL LOW
Sodium: 136
Sodium: 138

## 2011-06-27 LAB — CROSSMATCH
ABO/RH(D): O NEG
ABO/RH(D): O NEG
Antibody Screen: NEGATIVE
Antibody Screen: NEGATIVE

## 2011-06-27 LAB — COMPREHENSIVE METABOLIC PANEL
ALT: 10
AST: 20
Albumin: 3.9
Alkaline Phosphatase: 60
BUN: 15
CO2: 29
Calcium: 9.4
Chloride: 104
Creatinine, Ser: 1.36
GFR calc Af Amer: >60
GFR calc non Af Amer: 50 — ABNORMAL LOW
Glucose, Bld: 99
Potassium: 4.4
Sodium: 139
Total Bilirubin: 0.6
Total Protein: 6.7

## 2011-06-27 LAB — PROTIME-INR
INR: 1
Prothrombin Time: 13.7

## 2011-06-27 LAB — HEPARIN INDUCED PLATELET AGGREGATION (CONVERTED LAB)
Heparin 0.1 Donor: 4
Heparin 1 Patient: 4
Heparin 100 Donor: 1
Heparin 100 Patient: 2

## 2011-06-27 LAB — DIFFERENTIAL
Basophils Absolute: 0
Basophils Relative: 0
Eosinophils Absolute: 0.1
Eosinophils Relative: 2
Lymphocytes Relative: 29
Lymphs Abs: 2.4
Monocytes Absolute: 1.3 — ABNORMAL HIGH
Monocytes Relative: 16 — ABNORMAL HIGH
Neutro Abs: 4.3
Neutrophils Relative %: 53

## 2011-06-27 LAB — CBC
HCT: 24.1 — ABNORMAL LOW
HCT: 26.2 — ABNORMAL LOW
HCT: 26.3 — ABNORMAL LOW
HCT: 40.8
Hemoglobin: 10.6 — ABNORMAL LOW
Hemoglobin: 14
Hemoglobin: 7.3 — CL
Hemoglobin: 8.4 — ABNORMAL LOW
Hemoglobin: 9 — ABNORMAL LOW
MCHC: 34.3
MCHC: 34.5
MCV: 105.3 — ABNORMAL HIGH
MCV: 105.8 — ABNORMAL HIGH
Platelets: 114 — ABNORMAL LOW
Platelets: 172
RBC: 2.36 — ABNORMAL LOW
RBC: 2.94 — ABNORMAL LOW
RBC: 3.88 — ABNORMAL LOW
RDW: 13.1
RDW: 13.1
RDW: 13.2
RDW: 13.4
RDW: 13.4
RDW: 15.2
WBC: 13.6 — ABNORMAL HIGH
WBC: 8.3
WBC: 9.6

## 2011-06-27 LAB — URINALYSIS, ROUTINE W REFLEX MICROSCOPIC
Bilirubin Urine: NEGATIVE
Glucose, UA: NEGATIVE
Hgb urine dipstick: NEGATIVE
Ketones, ur: NEGATIVE
Nitrite: NEGATIVE
Protein, ur: NEGATIVE
Specific Gravity, Urine: 1.012
Urobilinogen, UA: 0.2
pH: 7

## 2011-06-27 LAB — ABO/RH: ABO/RH(D): O NEG

## 2011-06-27 LAB — APTT: aPTT: 29

## 2011-10-09 DIAGNOSIS — K219 Gastro-esophageal reflux disease without esophagitis: Secondary | ICD-10-CM | POA: Diagnosis not present

## 2011-10-17 DIAGNOSIS — N401 Enlarged prostate with lower urinary tract symptoms: Secondary | ICD-10-CM | POA: Diagnosis not present

## 2011-10-17 DIAGNOSIS — I714 Abdominal aortic aneurysm, without rupture: Secondary | ICD-10-CM | POA: Diagnosis not present

## 2011-10-17 DIAGNOSIS — I2510 Atherosclerotic heart disease of native coronary artery without angina pectoris: Secondary | ICD-10-CM | POA: Diagnosis not present

## 2011-10-17 DIAGNOSIS — R609 Edema, unspecified: Secondary | ICD-10-CM | POA: Diagnosis not present

## 2011-11-25 DIAGNOSIS — N2581 Secondary hyperparathyroidism of renal origin: Secondary | ICD-10-CM | POA: Diagnosis not present

## 2011-11-25 DIAGNOSIS — I129 Hypertensive chronic kidney disease with stage 1 through stage 4 chronic kidney disease, or unspecified chronic kidney disease: Secondary | ICD-10-CM | POA: Diagnosis not present

## 2011-11-25 DIAGNOSIS — R609 Edema, unspecified: Secondary | ICD-10-CM | POA: Diagnosis not present

## 2011-12-05 DIAGNOSIS — H903 Sensorineural hearing loss, bilateral: Secondary | ICD-10-CM | POA: Diagnosis not present

## 2011-12-23 DIAGNOSIS — M25559 Pain in unspecified hip: Secondary | ICD-10-CM | POA: Diagnosis not present

## 2011-12-23 DIAGNOSIS — Z23 Encounter for immunization: Secondary | ICD-10-CM | POA: Diagnosis not present

## 2012-01-09 DIAGNOSIS — K5900 Constipation, unspecified: Secondary | ICD-10-CM | POA: Diagnosis not present

## 2012-02-07 DIAGNOSIS — C44320 Squamous cell carcinoma of skin of unspecified parts of face: Secondary | ICD-10-CM | POA: Diagnosis not present

## 2012-02-07 DIAGNOSIS — L570 Actinic keratosis: Secondary | ICD-10-CM | POA: Diagnosis not present

## 2012-03-04 DIAGNOSIS — I719 Aortic aneurysm of unspecified site, without rupture: Secondary | ICD-10-CM | POA: Diagnosis not present

## 2012-03-12 DIAGNOSIS — I714 Abdominal aortic aneurysm, without rupture: Secondary | ICD-10-CM | POA: Diagnosis not present

## 2012-04-02 DIAGNOSIS — H40019 Open angle with borderline findings, low risk, unspecified eye: Secondary | ICD-10-CM | POA: Diagnosis not present

## 2012-05-19 DIAGNOSIS — N2581 Secondary hyperparathyroidism of renal origin: Secondary | ICD-10-CM | POA: Diagnosis not present

## 2012-05-26 DIAGNOSIS — I129 Hypertensive chronic kidney disease with stage 1 through stage 4 chronic kidney disease, or unspecified chronic kidney disease: Secondary | ICD-10-CM | POA: Diagnosis not present

## 2012-05-26 DIAGNOSIS — D649 Anemia, unspecified: Secondary | ICD-10-CM | POA: Diagnosis not present

## 2012-05-26 DIAGNOSIS — N2581 Secondary hyperparathyroidism of renal origin: Secondary | ICD-10-CM | POA: Diagnosis not present

## 2012-05-28 DIAGNOSIS — I2510 Atherosclerotic heart disease of native coronary artery without angina pectoris: Secondary | ICD-10-CM | POA: Diagnosis not present

## 2012-05-28 DIAGNOSIS — R609 Edema, unspecified: Secondary | ICD-10-CM | POA: Diagnosis not present

## 2012-05-28 DIAGNOSIS — I714 Abdominal aortic aneurysm, without rupture: Secondary | ICD-10-CM | POA: Diagnosis not present

## 2012-05-28 DIAGNOSIS — N401 Enlarged prostate with lower urinary tract symptoms: Secondary | ICD-10-CM | POA: Diagnosis not present

## 2012-06-02 DIAGNOSIS — E785 Hyperlipidemia, unspecified: Secondary | ICD-10-CM | POA: Diagnosis not present

## 2012-06-02 DIAGNOSIS — I2510 Atherosclerotic heart disease of native coronary artery without angina pectoris: Secondary | ICD-10-CM | POA: Diagnosis not present

## 2012-06-25 DIAGNOSIS — Z23 Encounter for immunization: Secondary | ICD-10-CM | POA: Diagnosis not present

## 2012-07-02 DIAGNOSIS — I714 Abdominal aortic aneurysm, without rupture: Secondary | ICD-10-CM | POA: Diagnosis not present

## 2012-07-02 DIAGNOSIS — I739 Peripheral vascular disease, unspecified: Secondary | ICD-10-CM | POA: Diagnosis not present

## 2012-07-23 DIAGNOSIS — N401 Enlarged prostate with lower urinary tract symptoms: Secondary | ICD-10-CM | POA: Diagnosis not present

## 2012-07-23 DIAGNOSIS — N139 Obstructive and reflux uropathy, unspecified: Secondary | ICD-10-CM | POA: Diagnosis not present

## 2012-10-23 DIAGNOSIS — J069 Acute upper respiratory infection, unspecified: Secondary | ICD-10-CM | POA: Diagnosis not present

## 2012-11-12 DIAGNOSIS — K219 Gastro-esophageal reflux disease without esophagitis: Secondary | ICD-10-CM | POA: Diagnosis not present

## 2012-11-16 DIAGNOSIS — D649 Anemia, unspecified: Secondary | ICD-10-CM | POA: Diagnosis not present

## 2012-11-16 DIAGNOSIS — N2581 Secondary hyperparathyroidism of renal origin: Secondary | ICD-10-CM | POA: Diagnosis not present

## 2012-11-17 DIAGNOSIS — H40019 Open angle with borderline findings, low risk, unspecified eye: Secondary | ICD-10-CM | POA: Diagnosis not present

## 2012-11-24 DIAGNOSIS — N2581 Secondary hyperparathyroidism of renal origin: Secondary | ICD-10-CM | POA: Diagnosis not present

## 2012-11-24 DIAGNOSIS — D649 Anemia, unspecified: Secondary | ICD-10-CM | POA: Diagnosis not present

## 2012-11-24 DIAGNOSIS — I129 Hypertensive chronic kidney disease with stage 1 through stage 4 chronic kidney disease, or unspecified chronic kidney disease: Secondary | ICD-10-CM | POA: Diagnosis not present

## 2012-12-03 DIAGNOSIS — I714 Abdominal aortic aneurysm, without rupture: Secondary | ICD-10-CM | POA: Diagnosis not present

## 2012-12-03 DIAGNOSIS — E7800 Pure hypercholesterolemia, unspecified: Secondary | ICD-10-CM | POA: Diagnosis not present

## 2012-12-03 DIAGNOSIS — I2510 Atherosclerotic heart disease of native coronary artery without angina pectoris: Secondary | ICD-10-CM | POA: Diagnosis not present

## 2012-12-03 DIAGNOSIS — N401 Enlarged prostate with lower urinary tract symptoms: Secondary | ICD-10-CM | POA: Diagnosis not present

## 2012-12-03 DIAGNOSIS — N139 Obstructive and reflux uropathy, unspecified: Secondary | ICD-10-CM | POA: Diagnosis not present

## 2012-12-16 DIAGNOSIS — I2510 Atherosclerotic heart disease of native coronary artery without angina pectoris: Secondary | ICD-10-CM | POA: Diagnosis not present

## 2012-12-16 DIAGNOSIS — E7800 Pure hypercholesterolemia, unspecified: Secondary | ICD-10-CM | POA: Diagnosis not present

## 2012-12-16 DIAGNOSIS — IMO0002 Reserved for concepts with insufficient information to code with codable children: Secondary | ICD-10-CM | POA: Diagnosis not present

## 2012-12-31 DIAGNOSIS — I719 Aortic aneurysm of unspecified site, without rupture: Secondary | ICD-10-CM | POA: Diagnosis not present

## 2012-12-31 DIAGNOSIS — I714 Abdominal aortic aneurysm, without rupture: Secondary | ICD-10-CM | POA: Diagnosis not present

## 2012-12-31 DIAGNOSIS — R609 Edema, unspecified: Secondary | ICD-10-CM | POA: Diagnosis not present

## 2012-12-31 DIAGNOSIS — E7800 Pure hypercholesterolemia, unspecified: Secondary | ICD-10-CM | POA: Diagnosis not present

## 2013-01-27 DIAGNOSIS — T82898A Other specified complication of vascular prosthetic devices, implants and grafts, initial encounter: Secondary | ICD-10-CM | POA: Diagnosis not present

## 2013-03-31 DIAGNOSIS — M940 Chondrocostal junction syndrome [Tietze]: Secondary | ICD-10-CM | POA: Diagnosis not present

## 2013-03-31 DIAGNOSIS — R079 Chest pain, unspecified: Secondary | ICD-10-CM | POA: Diagnosis not present

## 2013-04-28 DIAGNOSIS — I714 Abdominal aortic aneurysm, without rupture: Secondary | ICD-10-CM | POA: Diagnosis not present

## 2013-04-28 DIAGNOSIS — N401 Enlarged prostate with lower urinary tract symptoms: Secondary | ICD-10-CM | POA: Diagnosis not present

## 2013-04-28 DIAGNOSIS — E7800 Pure hypercholesterolemia, unspecified: Secondary | ICD-10-CM | POA: Diagnosis not present

## 2013-05-17 DIAGNOSIS — H40019 Open angle with borderline findings, low risk, unspecified eye: Secondary | ICD-10-CM | POA: Diagnosis not present

## 2013-05-25 DIAGNOSIS — D649 Anemia, unspecified: Secondary | ICD-10-CM | POA: Diagnosis not present

## 2013-05-27 DIAGNOSIS — G8929 Other chronic pain: Secondary | ICD-10-CM | POA: Diagnosis not present

## 2013-05-27 DIAGNOSIS — L570 Actinic keratosis: Secondary | ICD-10-CM | POA: Diagnosis not present

## 2013-05-27 DIAGNOSIS — R52 Pain, unspecified: Secondary | ICD-10-CM | POA: Diagnosis not present

## 2013-05-27 DIAGNOSIS — M545 Low back pain: Secondary | ICD-10-CM | POA: Diagnosis not present

## 2013-06-02 DIAGNOSIS — M545 Low back pain: Secondary | ICD-10-CM | POA: Diagnosis not present

## 2013-06-04 DIAGNOSIS — M545 Low back pain: Secondary | ICD-10-CM | POA: Diagnosis not present

## 2013-06-04 DIAGNOSIS — D649 Anemia, unspecified: Secondary | ICD-10-CM | POA: Diagnosis not present

## 2013-06-04 DIAGNOSIS — N2581 Secondary hyperparathyroidism of renal origin: Secondary | ICD-10-CM | POA: Diagnosis not present

## 2013-06-04 DIAGNOSIS — I129 Hypertensive chronic kidney disease with stage 1 through stage 4 chronic kidney disease, or unspecified chronic kidney disease: Secondary | ICD-10-CM | POA: Diagnosis not present

## 2013-06-08 DIAGNOSIS — L821 Other seborrheic keratosis: Secondary | ICD-10-CM | POA: Diagnosis not present

## 2013-06-08 DIAGNOSIS — L578 Other skin changes due to chronic exposure to nonionizing radiation: Secondary | ICD-10-CM | POA: Diagnosis not present

## 2013-06-08 DIAGNOSIS — L570 Actinic keratosis: Secondary | ICD-10-CM | POA: Diagnosis not present

## 2013-06-08 DIAGNOSIS — M545 Low back pain: Secondary | ICD-10-CM | POA: Diagnosis not present

## 2013-06-10 DIAGNOSIS — M545 Low back pain: Secondary | ICD-10-CM | POA: Diagnosis not present

## 2013-06-15 DIAGNOSIS — M545 Low back pain: Secondary | ICD-10-CM | POA: Diagnosis not present

## 2013-06-17 DIAGNOSIS — M545 Low back pain: Secondary | ICD-10-CM | POA: Diagnosis not present

## 2013-06-17 DIAGNOSIS — Z23 Encounter for immunization: Secondary | ICD-10-CM | POA: Diagnosis not present

## 2013-06-28 DIAGNOSIS — I714 Abdominal aortic aneurysm, without rupture: Secondary | ICD-10-CM | POA: Diagnosis not present

## 2013-07-12 DIAGNOSIS — R5381 Other malaise: Secondary | ICD-10-CM | POA: Diagnosis not present

## 2013-07-12 DIAGNOSIS — M6740 Ganglion, unspecified site: Secondary | ICD-10-CM | POA: Diagnosis not present

## 2013-07-12 DIAGNOSIS — Z0000 Encounter for general adult medical examination without abnormal findings: Secondary | ICD-10-CM | POA: Diagnosis not present

## 2013-07-12 DIAGNOSIS — D539 Nutritional anemia, unspecified: Secondary | ICD-10-CM | POA: Diagnosis not present

## 2013-07-12 DIAGNOSIS — E7800 Pure hypercholesterolemia, unspecified: Secondary | ICD-10-CM | POA: Diagnosis not present

## 2013-07-12 DIAGNOSIS — Z79899 Other long term (current) drug therapy: Secondary | ICD-10-CM | POA: Diagnosis not present

## 2013-07-12 DIAGNOSIS — M1990 Unspecified osteoarthritis, unspecified site: Secondary | ICD-10-CM | POA: Diagnosis not present

## 2013-07-26 DIAGNOSIS — R351 Nocturia: Secondary | ICD-10-CM | POA: Diagnosis not present

## 2013-07-26 DIAGNOSIS — N401 Enlarged prostate with lower urinary tract symptoms: Secondary | ICD-10-CM | POA: Diagnosis not present

## 2013-07-26 DIAGNOSIS — B356 Tinea cruris: Secondary | ICD-10-CM | POA: Diagnosis not present

## 2013-07-28 DIAGNOSIS — Z79899 Other long term (current) drug therapy: Secondary | ICD-10-CM | POA: Diagnosis not present

## 2013-09-18 DIAGNOSIS — K5900 Constipation, unspecified: Secondary | ICD-10-CM | POA: Diagnosis not present

## 2013-09-18 DIAGNOSIS — R1084 Generalized abdominal pain: Secondary | ICD-10-CM | POA: Diagnosis not present

## 2013-09-18 DIAGNOSIS — E7800 Pure hypercholesterolemia, unspecified: Secondary | ICD-10-CM | POA: Diagnosis not present

## 2013-09-18 DIAGNOSIS — Z79899 Other long term (current) drug therapy: Secondary | ICD-10-CM | POA: Diagnosis not present

## 2013-09-18 DIAGNOSIS — N281 Cyst of kidney, acquired: Secondary | ICD-10-CM | POA: Diagnosis not present

## 2013-09-18 DIAGNOSIS — I10 Essential (primary) hypertension: Secondary | ICD-10-CM | POA: Diagnosis not present

## 2013-10-27 DIAGNOSIS — I714 Abdominal aortic aneurysm, without rupture, unspecified: Secondary | ICD-10-CM | POA: Diagnosis not present

## 2013-10-27 DIAGNOSIS — Z79899 Other long term (current) drug therapy: Secondary | ICD-10-CM | POA: Diagnosis not present

## 2013-10-27 DIAGNOSIS — I2510 Atherosclerotic heart disease of native coronary artery without angina pectoris: Secondary | ICD-10-CM | POA: Diagnosis not present

## 2013-10-27 DIAGNOSIS — E7800 Pure hypercholesterolemia, unspecified: Secondary | ICD-10-CM | POA: Diagnosis not present

## 2013-11-17 DIAGNOSIS — H40019 Open angle with borderline findings, low risk, unspecified eye: Secondary | ICD-10-CM | POA: Diagnosis not present

## 2013-11-18 DIAGNOSIS — M1990 Unspecified osteoarthritis, unspecified site: Secondary | ICD-10-CM | POA: Diagnosis not present

## 2013-11-18 DIAGNOSIS — K5900 Constipation, unspecified: Secondary | ICD-10-CM | POA: Diagnosis not present

## 2013-12-07 DIAGNOSIS — K219 Gastro-esophageal reflux disease without esophagitis: Secondary | ICD-10-CM | POA: Diagnosis not present

## 2013-12-07 DIAGNOSIS — K5900 Constipation, unspecified: Secondary | ICD-10-CM | POA: Diagnosis not present

## 2013-12-08 DIAGNOSIS — H26499 Other secondary cataract, unspecified eye: Secondary | ICD-10-CM | POA: Diagnosis not present

## 2013-12-08 DIAGNOSIS — I2510 Atherosclerotic heart disease of native coronary artery without angina pectoris: Secondary | ICD-10-CM | POA: Diagnosis not present

## 2013-12-15 DIAGNOSIS — L538 Other specified erythematous conditions: Secondary | ICD-10-CM | POA: Diagnosis not present

## 2013-12-15 DIAGNOSIS — L259 Unspecified contact dermatitis, unspecified cause: Secondary | ICD-10-CM | POA: Diagnosis not present

## 2013-12-15 DIAGNOSIS — L570 Actinic keratosis: Secondary | ICD-10-CM | POA: Diagnosis not present

## 2013-12-16 DIAGNOSIS — D638 Anemia in other chronic diseases classified elsewhere: Secondary | ICD-10-CM | POA: Diagnosis not present

## 2013-12-16 DIAGNOSIS — N2581 Secondary hyperparathyroidism of renal origin: Secondary | ICD-10-CM | POA: Diagnosis not present

## 2013-12-16 DIAGNOSIS — N183 Chronic kidney disease, stage 3 unspecified: Secondary | ICD-10-CM | POA: Diagnosis not present

## 2013-12-16 DIAGNOSIS — I10 Essential (primary) hypertension: Secondary | ICD-10-CM | POA: Diagnosis not present

## 2013-12-30 DIAGNOSIS — N183 Chronic kidney disease, stage 3 unspecified: Secondary | ICD-10-CM | POA: Diagnosis not present

## 2014-02-10 DIAGNOSIS — Z0000 Encounter for general adult medical examination without abnormal findings: Secondary | ICD-10-CM | POA: Diagnosis not present

## 2014-02-10 DIAGNOSIS — Z79899 Other long term (current) drug therapy: Secondary | ICD-10-CM | POA: Diagnosis not present

## 2014-02-18 DIAGNOSIS — R609 Edema, unspecified: Secondary | ICD-10-CM | POA: Diagnosis not present

## 2014-02-24 DIAGNOSIS — I5022 Chronic systolic (congestive) heart failure: Secondary | ICD-10-CM | POA: Diagnosis not present

## 2014-02-24 DIAGNOSIS — Z79899 Other long term (current) drug therapy: Secondary | ICD-10-CM | POA: Diagnosis not present

## 2014-02-24 DIAGNOSIS — R609 Edema, unspecified: Secondary | ICD-10-CM | POA: Diagnosis not present

## 2014-02-24 DIAGNOSIS — I714 Abdominal aortic aneurysm, without rupture, unspecified: Secondary | ICD-10-CM | POA: Diagnosis not present

## 2014-02-24 DIAGNOSIS — E7800 Pure hypercholesterolemia, unspecified: Secondary | ICD-10-CM | POA: Diagnosis not present

## 2014-02-24 DIAGNOSIS — I428 Other cardiomyopathies: Secondary | ICD-10-CM | POA: Diagnosis not present

## 2014-02-24 DIAGNOSIS — I509 Heart failure, unspecified: Secondary | ICD-10-CM | POA: Diagnosis not present

## 2014-03-01 DIAGNOSIS — I428 Other cardiomyopathies: Secondary | ICD-10-CM | POA: Diagnosis not present

## 2014-04-05 DIAGNOSIS — I5022 Chronic systolic (congestive) heart failure: Secondary | ICD-10-CM | POA: Diagnosis not present

## 2014-04-05 DIAGNOSIS — I714 Abdominal aortic aneurysm, without rupture, unspecified: Secondary | ICD-10-CM | POA: Diagnosis not present

## 2014-04-05 DIAGNOSIS — R0602 Shortness of breath: Secondary | ICD-10-CM | POA: Diagnosis not present

## 2014-04-05 DIAGNOSIS — I2510 Atherosclerotic heart disease of native coronary artery without angina pectoris: Secondary | ICD-10-CM | POA: Diagnosis not present

## 2014-04-05 DIAGNOSIS — I428 Other cardiomyopathies: Secondary | ICD-10-CM | POA: Diagnosis not present

## 2014-04-05 DIAGNOSIS — I509 Heart failure, unspecified: Secondary | ICD-10-CM | POA: Diagnosis not present

## 2014-04-13 DIAGNOSIS — R351 Nocturia: Secondary | ICD-10-CM

## 2014-04-13 DIAGNOSIS — N400 Enlarged prostate without lower urinary tract symptoms: Secondary | ICD-10-CM

## 2014-04-13 DIAGNOSIS — N3941 Urge incontinence: Secondary | ICD-10-CM | POA: Diagnosis not present

## 2014-04-13 HISTORY — DX: Nocturia: R35.1

## 2014-04-13 HISTORY — DX: Benign prostatic hyperplasia without lower urinary tract symptoms: N40.0

## 2014-04-13 HISTORY — DX: Urge incontinence: N39.41

## 2014-04-30 DIAGNOSIS — L538 Other specified erythematous conditions: Secondary | ICD-10-CM | POA: Diagnosis not present

## 2014-05-03 DIAGNOSIS — I428 Other cardiomyopathies: Secondary | ICD-10-CM | POA: Diagnosis not present

## 2014-05-03 DIAGNOSIS — I714 Abdominal aortic aneurysm, without rupture, unspecified: Secondary | ICD-10-CM | POA: Diagnosis not present

## 2014-05-03 DIAGNOSIS — E7800 Pure hypercholesterolemia, unspecified: Secondary | ICD-10-CM | POA: Diagnosis not present

## 2014-05-03 DIAGNOSIS — R0602 Shortness of breath: Secondary | ICD-10-CM | POA: Diagnosis not present

## 2014-05-25 DIAGNOSIS — N400 Enlarged prostate without lower urinary tract symptoms: Secondary | ICD-10-CM | POA: Diagnosis not present

## 2014-06-01 DIAGNOSIS — I714 Abdominal aortic aneurysm, without rupture, unspecified: Secondary | ICD-10-CM | POA: Diagnosis not present

## 2014-06-01 DIAGNOSIS — R0602 Shortness of breath: Secondary | ICD-10-CM | POA: Diagnosis not present

## 2014-06-01 DIAGNOSIS — R609 Edema, unspecified: Secondary | ICD-10-CM | POA: Diagnosis not present

## 2014-06-01 DIAGNOSIS — Z79899 Other long term (current) drug therapy: Secondary | ICD-10-CM | POA: Diagnosis not present

## 2014-06-01 DIAGNOSIS — E7800 Pure hypercholesterolemia, unspecified: Secondary | ICD-10-CM | POA: Diagnosis not present

## 2014-06-01 DIAGNOSIS — I429 Cardiomyopathy, unspecified: Secondary | ICD-10-CM | POA: Diagnosis not present

## 2014-06-03 DIAGNOSIS — N183 Chronic kidney disease, stage 3 unspecified: Secondary | ICD-10-CM | POA: Diagnosis not present

## 2014-06-03 DIAGNOSIS — N2581 Secondary hyperparathyroidism of renal origin: Secondary | ICD-10-CM | POA: Diagnosis not present

## 2014-06-03 DIAGNOSIS — D638 Anemia in other chronic diseases classified elsewhere: Secondary | ICD-10-CM | POA: Diagnosis not present

## 2014-06-09 DIAGNOSIS — N183 Chronic kidney disease, stage 3 unspecified: Secondary | ICD-10-CM | POA: Diagnosis not present

## 2014-06-09 DIAGNOSIS — N2581 Secondary hyperparathyroidism of renal origin: Secondary | ICD-10-CM | POA: Diagnosis not present

## 2014-06-09 DIAGNOSIS — I10 Essential (primary) hypertension: Secondary | ICD-10-CM | POA: Diagnosis not present

## 2014-06-09 DIAGNOSIS — D638 Anemia in other chronic diseases classified elsewhere: Secondary | ICD-10-CM | POA: Diagnosis not present

## 2014-06-17 DIAGNOSIS — R002 Palpitations: Secondary | ICD-10-CM | POA: Diagnosis not present

## 2014-06-24 DIAGNOSIS — Z23 Encounter for immunization: Secondary | ICD-10-CM | POA: Diagnosis not present

## 2014-06-27 DIAGNOSIS — H40019 Open angle with borderline findings, low risk, unspecified eye: Secondary | ICD-10-CM | POA: Diagnosis not present

## 2014-07-19 DIAGNOSIS — I714 Abdominal aortic aneurysm, without rupture: Secondary | ICD-10-CM | POA: Diagnosis not present

## 2014-07-19 DIAGNOSIS — R109 Unspecified abdominal pain: Secondary | ICD-10-CM | POA: Diagnosis not present

## 2014-11-24 DIAGNOSIS — N401 Enlarged prostate with lower urinary tract symptoms: Secondary | ICD-10-CM | POA: Diagnosis not present

## 2014-12-06 DIAGNOSIS — N2581 Secondary hyperparathyroidism of renal origin: Secondary | ICD-10-CM | POA: Diagnosis not present

## 2014-12-06 DIAGNOSIS — N183 Chronic kidney disease, stage 3 (moderate): Secondary | ICD-10-CM | POA: Diagnosis not present

## 2014-12-06 DIAGNOSIS — D638 Anemia in other chronic diseases classified elsewhere: Secondary | ICD-10-CM | POA: Diagnosis not present

## 2014-12-09 DIAGNOSIS — N2581 Secondary hyperparathyroidism of renal origin: Secondary | ICD-10-CM | POA: Diagnosis not present

## 2014-12-09 DIAGNOSIS — I10 Essential (primary) hypertension: Secondary | ICD-10-CM | POA: Diagnosis not present

## 2014-12-09 DIAGNOSIS — N183 Chronic kidney disease, stage 3 (moderate): Secondary | ICD-10-CM | POA: Diagnosis not present

## 2014-12-09 DIAGNOSIS — D638 Anemia in other chronic diseases classified elsewhere: Secondary | ICD-10-CM | POA: Diagnosis not present

## 2014-12-28 DIAGNOSIS — H3531 Nonexudative age-related macular degeneration: Secondary | ICD-10-CM | POA: Diagnosis not present

## 2014-12-29 DIAGNOSIS — Z96653 Presence of artificial knee joint, bilateral: Secondary | ICD-10-CM | POA: Diagnosis not present

## 2014-12-29 DIAGNOSIS — M25561 Pain in right knee: Secondary | ICD-10-CM | POA: Diagnosis not present

## 2015-01-13 DIAGNOSIS — M25551 Pain in right hip: Secondary | ICD-10-CM | POA: Diagnosis not present

## 2015-01-13 DIAGNOSIS — Z1389 Encounter for screening for other disorder: Secondary | ICD-10-CM | POA: Diagnosis not present

## 2015-01-13 DIAGNOSIS — Z9181 History of falling: Secondary | ICD-10-CM | POA: Diagnosis not present

## 2015-01-13 DIAGNOSIS — M545 Low back pain: Secondary | ICD-10-CM | POA: Diagnosis not present

## 2015-01-31 DIAGNOSIS — I428 Other cardiomyopathies: Secondary | ICD-10-CM | POA: Diagnosis not present

## 2015-01-31 DIAGNOSIS — I10 Essential (primary) hypertension: Secondary | ICD-10-CM | POA: Diagnosis not present

## 2015-02-08 DIAGNOSIS — I351 Nonrheumatic aortic (valve) insufficiency: Secondary | ICD-10-CM | POA: Diagnosis not present

## 2015-03-21 DIAGNOSIS — K219 Gastro-esophageal reflux disease without esophagitis: Secondary | ICD-10-CM | POA: Diagnosis not present

## 2015-05-16 DIAGNOSIS — I10 Essential (primary) hypertension: Secondary | ICD-10-CM | POA: Diagnosis not present

## 2015-05-16 DIAGNOSIS — M8580 Other specified disorders of bone density and structure, unspecified site: Secondary | ICD-10-CM | POA: Diagnosis not present

## 2015-05-16 DIAGNOSIS — Z1389 Encounter for screening for other disorder: Secondary | ICD-10-CM | POA: Diagnosis not present

## 2015-05-16 DIAGNOSIS — Z23 Encounter for immunization: Secondary | ICD-10-CM | POA: Diagnosis not present

## 2015-05-16 DIAGNOSIS — E780 Pure hypercholesterolemia: Secondary | ICD-10-CM | POA: Diagnosis not present

## 2015-05-16 DIAGNOSIS — D509 Iron deficiency anemia, unspecified: Secondary | ICD-10-CM | POA: Diagnosis not present

## 2015-05-16 DIAGNOSIS — Z79899 Other long term (current) drug therapy: Secondary | ICD-10-CM | POA: Diagnosis not present

## 2015-05-16 DIAGNOSIS — Z0000 Encounter for general adult medical examination without abnormal findings: Secondary | ICD-10-CM | POA: Diagnosis not present

## 2015-05-18 DIAGNOSIS — M545 Low back pain: Secondary | ICD-10-CM | POA: Diagnosis not present

## 2015-05-18 DIAGNOSIS — M5136 Other intervertebral disc degeneration, lumbar region: Secondary | ICD-10-CM | POA: Diagnosis not present

## 2015-05-18 DIAGNOSIS — H903 Sensorineural hearing loss, bilateral: Secondary | ICD-10-CM | POA: Diagnosis not present

## 2015-05-22 DIAGNOSIS — I10 Essential (primary) hypertension: Secondary | ICD-10-CM | POA: Diagnosis not present

## 2015-05-22 DIAGNOSIS — Z6828 Body mass index (BMI) 28.0-28.9, adult: Secondary | ICD-10-CM | POA: Diagnosis not present

## 2015-05-22 DIAGNOSIS — M545 Low back pain: Secondary | ICD-10-CM | POA: Diagnosis not present

## 2015-05-31 DIAGNOSIS — M545 Low back pain: Secondary | ICD-10-CM | POA: Diagnosis not present

## 2015-06-05 DIAGNOSIS — M545 Low back pain: Secondary | ICD-10-CM | POA: Diagnosis not present

## 2015-06-07 DIAGNOSIS — M545 Low back pain: Secondary | ICD-10-CM | POA: Diagnosis not present

## 2015-06-14 DIAGNOSIS — M545 Low back pain: Secondary | ICD-10-CM | POA: Diagnosis not present

## 2015-06-16 DIAGNOSIS — M545 Low back pain: Secondary | ICD-10-CM | POA: Diagnosis not present

## 2015-06-19 DIAGNOSIS — M545 Low back pain: Secondary | ICD-10-CM | POA: Diagnosis not present

## 2015-06-20 DIAGNOSIS — L821 Other seborrheic keratosis: Secondary | ICD-10-CM | POA: Diagnosis not present

## 2015-06-20 DIAGNOSIS — L578 Other skin changes due to chronic exposure to nonionizing radiation: Secondary | ICD-10-CM | POA: Diagnosis not present

## 2015-06-20 DIAGNOSIS — L570 Actinic keratosis: Secondary | ICD-10-CM | POA: Diagnosis not present

## 2015-06-21 DIAGNOSIS — M545 Low back pain: Secondary | ICD-10-CM | POA: Diagnosis not present

## 2015-06-27 DIAGNOSIS — M545 Low back pain: Secondary | ICD-10-CM | POA: Diagnosis not present

## 2015-06-29 DIAGNOSIS — Z23 Encounter for immunization: Secondary | ICD-10-CM | POA: Diagnosis not present

## 2015-06-30 DIAGNOSIS — M545 Low back pain: Secondary | ICD-10-CM | POA: Diagnosis not present

## 2015-07-04 DIAGNOSIS — M545 Low back pain: Secondary | ICD-10-CM | POA: Diagnosis not present

## 2015-07-05 DIAGNOSIS — H3531 Nonexudative age-related macular degeneration: Secondary | ICD-10-CM | POA: Diagnosis not present

## 2015-07-06 DIAGNOSIS — M545 Low back pain: Secondary | ICD-10-CM | POA: Diagnosis not present

## 2015-07-10 DIAGNOSIS — I255 Ischemic cardiomyopathy: Secondary | ICD-10-CM

## 2015-07-10 DIAGNOSIS — I429 Cardiomyopathy, unspecified: Secondary | ICD-10-CM | POA: Insufficient documentation

## 2015-07-10 DIAGNOSIS — Z951 Presence of aortocoronary bypass graft: Secondary | ICD-10-CM

## 2015-07-10 DIAGNOSIS — E785 Hyperlipidemia, unspecified: Secondary | ICD-10-CM

## 2015-07-10 HISTORY — DX: Hyperlipidemia, unspecified: E78.5

## 2015-07-10 HISTORY — DX: Ischemic cardiomyopathy: I25.5

## 2015-07-10 HISTORY — DX: Presence of aortocoronary bypass graft: Z95.1

## 2015-07-10 HISTORY — DX: Cardiomyopathy, unspecified: I42.9

## 2015-08-02 DIAGNOSIS — I714 Abdominal aortic aneurysm, without rupture: Secondary | ICD-10-CM | POA: Diagnosis not present

## 2015-08-02 DIAGNOSIS — I713 Abdominal aortic aneurysm, ruptured: Secondary | ICD-10-CM | POA: Diagnosis not present

## 2015-08-08 DIAGNOSIS — M545 Low back pain: Secondary | ICD-10-CM | POA: Diagnosis not present

## 2015-08-08 DIAGNOSIS — I10 Essential (primary) hypertension: Secondary | ICD-10-CM | POA: Diagnosis not present

## 2015-08-17 DIAGNOSIS — D638 Anemia in other chronic diseases classified elsewhere: Secondary | ICD-10-CM | POA: Diagnosis not present

## 2015-08-17 DIAGNOSIS — N2581 Secondary hyperparathyroidism of renal origin: Secondary | ICD-10-CM | POA: Diagnosis not present

## 2015-08-17 DIAGNOSIS — N183 Chronic kidney disease, stage 3 (moderate): Secondary | ICD-10-CM | POA: Diagnosis not present

## 2015-08-24 DIAGNOSIS — N2581 Secondary hyperparathyroidism of renal origin: Secondary | ICD-10-CM | POA: Diagnosis not present

## 2015-08-24 DIAGNOSIS — D638 Anemia in other chronic diseases classified elsewhere: Secondary | ICD-10-CM | POA: Diagnosis not present

## 2015-08-24 DIAGNOSIS — I10 Essential (primary) hypertension: Secondary | ICD-10-CM | POA: Diagnosis not present

## 2015-08-24 DIAGNOSIS — N183 Chronic kidney disease, stage 3 (moderate): Secondary | ICD-10-CM | POA: Diagnosis not present

## 2015-10-23 DIAGNOSIS — L821 Other seborrheic keratosis: Secondary | ICD-10-CM | POA: Diagnosis not present

## 2015-10-23 DIAGNOSIS — L304 Erythema intertrigo: Secondary | ICD-10-CM | POA: Diagnosis not present

## 2015-10-23 DIAGNOSIS — L570 Actinic keratosis: Secondary | ICD-10-CM | POA: Diagnosis not present

## 2015-10-23 DIAGNOSIS — L578 Other skin changes due to chronic exposure to nonionizing radiation: Secondary | ICD-10-CM | POA: Diagnosis not present

## 2015-10-23 DIAGNOSIS — L820 Inflamed seborrheic keratosis: Secondary | ICD-10-CM | POA: Diagnosis not present

## 2015-10-30 DIAGNOSIS — I714 Abdominal aortic aneurysm, without rupture: Secondary | ICD-10-CM | POA: Diagnosis not present

## 2015-10-30 DIAGNOSIS — N281 Cyst of kidney, acquired: Secondary | ICD-10-CM | POA: Diagnosis not present

## 2015-11-15 DIAGNOSIS — I714 Abdominal aortic aneurysm, without rupture: Secondary | ICD-10-CM | POA: Diagnosis not present

## 2015-11-15 DIAGNOSIS — T82330A Leakage of aortic (bifurcation) graft (replacement), initial encounter: Secondary | ICD-10-CM | POA: Diagnosis not present

## 2015-11-16 DIAGNOSIS — IMO0001 Reserved for inherently not codable concepts without codable children: Secondary | ICD-10-CM

## 2015-11-16 DIAGNOSIS — T82330D Leakage of aortic (bifurcation) graft (replacement), subsequent encounter: Secondary | ICD-10-CM | POA: Insufficient documentation

## 2015-11-16 HISTORY — DX: Reserved for inherently not codable concepts without codable children: IMO0001

## 2015-11-21 DIAGNOSIS — M1990 Unspecified osteoarthritis, unspecified site: Secondary | ICD-10-CM | POA: Diagnosis not present

## 2015-11-21 DIAGNOSIS — T82330D Leakage of aortic (bifurcation) graft (replacement), subsequent encounter: Secondary | ICD-10-CM | POA: Diagnosis not present

## 2015-11-21 DIAGNOSIS — Z79891 Long term (current) use of opiate analgesic: Secondary | ICD-10-CM | POA: Diagnosis not present

## 2015-11-21 DIAGNOSIS — Z7902 Long term (current) use of antithrombotics/antiplatelets: Secondary | ICD-10-CM | POA: Diagnosis not present

## 2015-11-21 DIAGNOSIS — N189 Chronic kidney disease, unspecified: Secondary | ICD-10-CM | POA: Diagnosis not present

## 2015-11-21 DIAGNOSIS — Z79899 Other long term (current) drug therapy: Secondary | ICD-10-CM | POA: Diagnosis not present

## 2015-11-21 DIAGNOSIS — G8929 Other chronic pain: Secondary | ICD-10-CM | POA: Diagnosis not present

## 2015-11-21 DIAGNOSIS — N400 Enlarged prostate without lower urinary tract symptoms: Secondary | ICD-10-CM | POA: Diagnosis not present

## 2015-11-21 DIAGNOSIS — Z8249 Family history of ischemic heart disease and other diseases of the circulatory system: Secondary | ICD-10-CM | POA: Diagnosis not present

## 2015-11-21 DIAGNOSIS — E7800 Pure hypercholesterolemia, unspecified: Secondary | ICD-10-CM | POA: Diagnosis not present

## 2015-11-21 DIAGNOSIS — Q631 Lobulated, fused and horseshoe kidney: Secondary | ICD-10-CM | POA: Diagnosis not present

## 2015-11-21 DIAGNOSIS — I2510 Atherosclerotic heart disease of native coronary artery without angina pectoris: Secondary | ICD-10-CM | POA: Diagnosis not present

## 2015-11-21 DIAGNOSIS — T82330A Leakage of aortic (bifurcation) graft (replacement), initial encounter: Secondary | ICD-10-CM | POA: Diagnosis not present

## 2015-11-21 DIAGNOSIS — Z85828 Personal history of other malignant neoplasm of skin: Secondary | ICD-10-CM | POA: Diagnosis not present

## 2015-11-21 DIAGNOSIS — I129 Hypertensive chronic kidney disease with stage 1 through stage 4 chronic kidney disease, or unspecified chronic kidney disease: Secondary | ICD-10-CM | POA: Diagnosis not present

## 2015-11-21 DIAGNOSIS — Z951 Presence of aortocoronary bypass graft: Secondary | ICD-10-CM | POA: Diagnosis not present

## 2015-12-06 DIAGNOSIS — I744 Embolism and thrombosis of arteries of extremities, unspecified: Secondary | ICD-10-CM | POA: Diagnosis not present

## 2015-12-06 DIAGNOSIS — T82330A Leakage of aortic (bifurcation) graft (replacement), initial encounter: Secondary | ICD-10-CM | POA: Diagnosis not present

## 2015-12-06 DIAGNOSIS — T82330D Leakage of aortic (bifurcation) graft (replacement), subsequent encounter: Secondary | ICD-10-CM | POA: Diagnosis not present

## 2015-12-06 DIAGNOSIS — I714 Abdominal aortic aneurysm, without rupture: Secondary | ICD-10-CM | POA: Diagnosis not present

## 2015-12-13 DIAGNOSIS — T169XXA Foreign body in ear, unspecified ear, initial encounter: Secondary | ICD-10-CM | POA: Diagnosis not present

## 2015-12-13 DIAGNOSIS — I10 Essential (primary) hypertension: Secondary | ICD-10-CM | POA: Diagnosis not present

## 2016-01-01 DIAGNOSIS — H353131 Nonexudative age-related macular degeneration, bilateral, early dry stage: Secondary | ICD-10-CM | POA: Diagnosis not present

## 2016-01-01 DIAGNOSIS — H524 Presbyopia: Secondary | ICD-10-CM | POA: Diagnosis not present

## 2016-01-11 DIAGNOSIS — N138 Other obstructive and reflux uropathy: Secondary | ICD-10-CM | POA: Diagnosis not present

## 2016-01-11 DIAGNOSIS — N401 Enlarged prostate with lower urinary tract symptoms: Secondary | ICD-10-CM | POA: Diagnosis not present

## 2016-01-23 DIAGNOSIS — I429 Cardiomyopathy, unspecified: Secondary | ICD-10-CM | POA: Diagnosis not present

## 2016-01-23 DIAGNOSIS — E785 Hyperlipidemia, unspecified: Secondary | ICD-10-CM | POA: Diagnosis not present

## 2016-01-23 DIAGNOSIS — Z951 Presence of aortocoronary bypass graft: Secondary | ICD-10-CM | POA: Diagnosis not present

## 2016-01-23 DIAGNOSIS — I350 Nonrheumatic aortic (valve) stenosis: Secondary | ICD-10-CM | POA: Diagnosis not present

## 2016-01-29 DIAGNOSIS — I350 Nonrheumatic aortic (valve) stenosis: Secondary | ICD-10-CM | POA: Diagnosis not present

## 2016-01-29 DIAGNOSIS — I429 Cardiomyopathy, unspecified: Secondary | ICD-10-CM | POA: Diagnosis not present

## 2016-01-29 DIAGNOSIS — E785 Hyperlipidemia, unspecified: Secondary | ICD-10-CM | POA: Diagnosis not present

## 2016-01-29 DIAGNOSIS — Z951 Presence of aortocoronary bypass graft: Secondary | ICD-10-CM | POA: Diagnosis not present

## 2016-02-22 DIAGNOSIS — I110 Hypertensive heart disease with heart failure: Secondary | ICD-10-CM

## 2016-02-22 HISTORY — DX: Hypertensive heart disease with heart failure: I11.0

## 2016-02-23 DIAGNOSIS — E785 Hyperlipidemia, unspecified: Secondary | ICD-10-CM | POA: Diagnosis not present

## 2016-02-23 DIAGNOSIS — I110 Hypertensive heart disease with heart failure: Secondary | ICD-10-CM | POA: Diagnosis not present

## 2016-02-23 DIAGNOSIS — M545 Low back pain: Secondary | ICD-10-CM | POA: Diagnosis not present

## 2016-02-23 DIAGNOSIS — Z951 Presence of aortocoronary bypass graft: Secondary | ICD-10-CM | POA: Diagnosis not present

## 2016-02-28 DIAGNOSIS — I110 Hypertensive heart disease with heart failure: Secondary | ICD-10-CM | POA: Diagnosis not present

## 2016-03-01 DIAGNOSIS — D638 Anemia in other chronic diseases classified elsewhere: Secondary | ICD-10-CM | POA: Diagnosis not present

## 2016-03-01 DIAGNOSIS — N183 Chronic kidney disease, stage 3 (moderate): Secondary | ICD-10-CM | POA: Diagnosis not present

## 2016-03-01 DIAGNOSIS — N2581 Secondary hyperparathyroidism of renal origin: Secondary | ICD-10-CM | POA: Diagnosis not present

## 2016-03-07 DIAGNOSIS — I10 Essential (primary) hypertension: Secondary | ICD-10-CM | POA: Diagnosis not present

## 2016-03-07 DIAGNOSIS — N2581 Secondary hyperparathyroidism of renal origin: Secondary | ICD-10-CM | POA: Diagnosis not present

## 2016-03-07 DIAGNOSIS — D638 Anemia in other chronic diseases classified elsewhere: Secondary | ICD-10-CM | POA: Diagnosis not present

## 2016-03-07 DIAGNOSIS — N183 Chronic kidney disease, stage 3 (moderate): Secondary | ICD-10-CM | POA: Diagnosis not present

## 2016-03-08 DIAGNOSIS — N184 Chronic kidney disease, stage 4 (severe): Secondary | ICD-10-CM | POA: Insufficient documentation

## 2016-03-08 DIAGNOSIS — I350 Nonrheumatic aortic (valve) stenosis: Secondary | ICD-10-CM

## 2016-03-08 DIAGNOSIS — I110 Hypertensive heart disease with heart failure: Secondary | ICD-10-CM | POA: Diagnosis not present

## 2016-03-08 HISTORY — DX: Nonrheumatic aortic (valve) stenosis: I35.0

## 2016-03-08 HISTORY — DX: Chronic kidney disease, stage 4 (severe): N18.4

## 2016-03-19 DIAGNOSIS — M25561 Pain in right knee: Secondary | ICD-10-CM | POA: Diagnosis not present

## 2016-03-25 DIAGNOSIS — Z96651 Presence of right artificial knee joint: Secondary | ICD-10-CM | POA: Diagnosis not present

## 2016-03-25 DIAGNOSIS — M25561 Pain in right knee: Secondary | ICD-10-CM | POA: Diagnosis not present

## 2016-03-25 DIAGNOSIS — Z471 Aftercare following joint replacement surgery: Secondary | ICD-10-CM | POA: Diagnosis not present

## 2016-04-22 DIAGNOSIS — L570 Actinic keratosis: Secondary | ICD-10-CM | POA: Diagnosis not present

## 2016-04-22 DIAGNOSIS — L821 Other seborrheic keratosis: Secondary | ICD-10-CM | POA: Diagnosis not present

## 2016-04-22 DIAGNOSIS — C44222 Squamous cell carcinoma of skin of right ear and external auricular canal: Secondary | ICD-10-CM | POA: Diagnosis not present

## 2016-04-22 DIAGNOSIS — L578 Other skin changes due to chronic exposure to nonionizing radiation: Secondary | ICD-10-CM | POA: Diagnosis not present

## 2016-05-31 ENCOUNTER — Other Ambulatory Visit: Payer: Self-pay

## 2016-06-04 DIAGNOSIS — H6120 Impacted cerumen, unspecified ear: Secondary | ICD-10-CM | POA: Diagnosis not present

## 2016-06-04 DIAGNOSIS — Z9181 History of falling: Secondary | ICD-10-CM | POA: Diagnosis not present

## 2016-06-04 DIAGNOSIS — Z1389 Encounter for screening for other disorder: Secondary | ICD-10-CM | POA: Diagnosis not present

## 2016-06-04 DIAGNOSIS — Z0000 Encounter for general adult medical examination without abnormal findings: Secondary | ICD-10-CM | POA: Diagnosis not present

## 2016-06-04 DIAGNOSIS — H7391 Unspecified disorder of tympanic membrane, right ear: Secondary | ICD-10-CM | POA: Diagnosis not present

## 2016-06-24 DIAGNOSIS — I110 Hypertensive heart disease with heart failure: Secondary | ICD-10-CM | POA: Diagnosis not present

## 2016-06-24 DIAGNOSIS — E785 Hyperlipidemia, unspecified: Secondary | ICD-10-CM | POA: Diagnosis not present

## 2016-06-24 DIAGNOSIS — N184 Chronic kidney disease, stage 4 (severe): Secondary | ICD-10-CM | POA: Diagnosis not present

## 2016-06-24 DIAGNOSIS — Z951 Presence of aortocoronary bypass graft: Secondary | ICD-10-CM | POA: Diagnosis not present

## 2016-06-28 DIAGNOSIS — Z23 Encounter for immunization: Secondary | ICD-10-CM | POA: Diagnosis not present

## 2016-07-01 DIAGNOSIS — H26493 Other secondary cataract, bilateral: Secondary | ICD-10-CM | POA: Diagnosis not present

## 2016-07-01 DIAGNOSIS — H353131 Nonexudative age-related macular degeneration, bilateral, early dry stage: Secondary | ICD-10-CM | POA: Diagnosis not present

## 2016-07-16 DIAGNOSIS — K219 Gastro-esophageal reflux disease without esophagitis: Secondary | ICD-10-CM | POA: Diagnosis not present

## 2016-09-10 DIAGNOSIS — N2581 Secondary hyperparathyroidism of renal origin: Secondary | ICD-10-CM | POA: Diagnosis not present

## 2016-09-10 DIAGNOSIS — N183 Chronic kidney disease, stage 3 (moderate): Secondary | ICD-10-CM | POA: Diagnosis not present

## 2016-09-11 DIAGNOSIS — Z1389 Encounter for screening for other disorder: Secondary | ICD-10-CM | POA: Diagnosis not present

## 2016-09-11 DIAGNOSIS — D518 Other vitamin B12 deficiency anemias: Secondary | ICD-10-CM | POA: Diagnosis not present

## 2016-09-11 DIAGNOSIS — E785 Hyperlipidemia, unspecified: Secondary | ICD-10-CM | POA: Diagnosis not present

## 2016-09-11 DIAGNOSIS — G629 Polyneuropathy, unspecified: Secondary | ICD-10-CM | POA: Diagnosis not present

## 2016-09-11 DIAGNOSIS — R269 Unspecified abnormalities of gait and mobility: Secondary | ICD-10-CM | POA: Diagnosis not present

## 2016-09-11 DIAGNOSIS — Z9181 History of falling: Secondary | ICD-10-CM | POA: Diagnosis not present

## 2016-09-16 DIAGNOSIS — I10 Essential (primary) hypertension: Secondary | ICD-10-CM | POA: Diagnosis not present

## 2016-09-16 DIAGNOSIS — N2581 Secondary hyperparathyroidism of renal origin: Secondary | ICD-10-CM | POA: Diagnosis not present

## 2016-09-16 DIAGNOSIS — N183 Chronic kidney disease, stage 3 (moderate): Secondary | ICD-10-CM | POA: Diagnosis not present

## 2016-09-16 DIAGNOSIS — D638 Anemia in other chronic diseases classified elsewhere: Secondary | ICD-10-CM | POA: Diagnosis not present

## 2016-09-23 DIAGNOSIS — I110 Hypertensive heart disease with heart failure: Secondary | ICD-10-CM | POA: Diagnosis not present

## 2016-09-23 DIAGNOSIS — N184 Chronic kidney disease, stage 4 (severe): Secondary | ICD-10-CM | POA: Diagnosis not present

## 2016-09-23 DIAGNOSIS — Z951 Presence of aortocoronary bypass graft: Secondary | ICD-10-CM | POA: Diagnosis not present

## 2016-09-23 DIAGNOSIS — E785 Hyperlipidemia, unspecified: Secondary | ICD-10-CM | POA: Diagnosis not present

## 2016-10-22 DIAGNOSIS — J209 Acute bronchitis, unspecified: Secondary | ICD-10-CM | POA: Diagnosis not present

## 2016-10-22 DIAGNOSIS — N289 Disorder of kidney and ureter, unspecified: Secondary | ICD-10-CM | POA: Diagnosis not present

## 2016-11-06 DIAGNOSIS — L820 Inflamed seborrheic keratosis: Secondary | ICD-10-CM | POA: Diagnosis not present

## 2016-11-06 DIAGNOSIS — L570 Actinic keratosis: Secondary | ICD-10-CM | POA: Diagnosis not present

## 2016-11-06 DIAGNOSIS — L821 Other seborrheic keratosis: Secondary | ICD-10-CM | POA: Diagnosis not present

## 2016-11-06 DIAGNOSIS — L578 Other skin changes due to chronic exposure to nonionizing radiation: Secondary | ICD-10-CM | POA: Diagnosis not present

## 2016-11-06 DIAGNOSIS — D0421 Carcinoma in situ of skin of right ear and external auricular canal: Secondary | ICD-10-CM | POA: Diagnosis not present

## 2016-11-06 DIAGNOSIS — D0462 Carcinoma in situ of skin of left upper limb, including shoulder: Secondary | ICD-10-CM | POA: Diagnosis not present

## 2016-12-09 DIAGNOSIS — D0421 Carcinoma in situ of skin of right ear and external auricular canal: Secondary | ICD-10-CM | POA: Diagnosis not present

## 2016-12-09 DIAGNOSIS — L820 Inflamed seborrheic keratosis: Secondary | ICD-10-CM | POA: Diagnosis not present

## 2016-12-09 DIAGNOSIS — L821 Other seborrheic keratosis: Secondary | ICD-10-CM | POA: Diagnosis not present

## 2016-12-09 DIAGNOSIS — L570 Actinic keratosis: Secondary | ICD-10-CM | POA: Diagnosis not present

## 2017-01-09 DIAGNOSIS — Z6828 Body mass index (BMI) 28.0-28.9, adult: Secondary | ICD-10-CM | POA: Diagnosis not present

## 2017-01-09 DIAGNOSIS — Z1389 Encounter for screening for other disorder: Secondary | ICD-10-CM | POA: Diagnosis not present

## 2017-01-09 DIAGNOSIS — J029 Acute pharyngitis, unspecified: Secondary | ICD-10-CM | POA: Diagnosis not present

## 2017-01-16 DIAGNOSIS — H26493 Other secondary cataract, bilateral: Secondary | ICD-10-CM | POA: Diagnosis not present

## 2017-01-16 DIAGNOSIS — H353131 Nonexudative age-related macular degeneration, bilateral, early dry stage: Secondary | ICD-10-CM | POA: Diagnosis not present

## 2017-01-24 DIAGNOSIS — N401 Enlarged prostate with lower urinary tract symptoms: Secondary | ICD-10-CM | POA: Diagnosis not present

## 2017-01-24 DIAGNOSIS — N138 Other obstructive and reflux uropathy: Secondary | ICD-10-CM | POA: Diagnosis not present

## 2017-01-24 DIAGNOSIS — N400 Enlarged prostate without lower urinary tract symptoms: Secondary | ICD-10-CM | POA: Diagnosis not present

## 2017-03-25 DIAGNOSIS — I429 Cardiomyopathy, unspecified: Secondary | ICD-10-CM | POA: Diagnosis not present

## 2017-03-25 DIAGNOSIS — Z951 Presence of aortocoronary bypass graft: Secondary | ICD-10-CM | POA: Diagnosis not present

## 2017-03-25 DIAGNOSIS — N184 Chronic kidney disease, stage 4 (severe): Secondary | ICD-10-CM | POA: Diagnosis not present

## 2017-03-25 DIAGNOSIS — E785 Hyperlipidemia, unspecified: Secondary | ICD-10-CM | POA: Diagnosis not present

## 2017-03-25 DIAGNOSIS — I350 Nonrheumatic aortic (valve) stenosis: Secondary | ICD-10-CM | POA: Diagnosis not present

## 2017-04-21 DIAGNOSIS — L820 Inflamed seborrheic keratosis: Secondary | ICD-10-CM | POA: Diagnosis not present

## 2017-04-21 DIAGNOSIS — L821 Other seborrheic keratosis: Secondary | ICD-10-CM | POA: Diagnosis not present

## 2017-04-21 DIAGNOSIS — L578 Other skin changes due to chronic exposure to nonionizing radiation: Secondary | ICD-10-CM | POA: Diagnosis not present

## 2017-04-21 DIAGNOSIS — L570 Actinic keratosis: Secondary | ICD-10-CM | POA: Diagnosis not present

## 2017-05-22 DIAGNOSIS — E785 Hyperlipidemia, unspecified: Secondary | ICD-10-CM | POA: Diagnosis not present

## 2017-06-16 DIAGNOSIS — N183 Chronic kidney disease, stage 3 (moderate): Secondary | ICD-10-CM | POA: Diagnosis not present

## 2017-06-16 DIAGNOSIS — D638 Anemia in other chronic diseases classified elsewhere: Secondary | ICD-10-CM | POA: Diagnosis not present

## 2017-06-16 DIAGNOSIS — N2581 Secondary hyperparathyroidism of renal origin: Secondary | ICD-10-CM | POA: Diagnosis not present

## 2017-06-16 DIAGNOSIS — I10 Essential (primary) hypertension: Secondary | ICD-10-CM | POA: Diagnosis not present

## 2017-07-05 DIAGNOSIS — R531 Weakness: Secondary | ICD-10-CM | POA: Diagnosis not present

## 2017-07-05 DIAGNOSIS — I2510 Atherosclerotic heart disease of native coronary artery without angina pectoris: Secondary | ICD-10-CM | POA: Diagnosis not present

## 2017-07-05 DIAGNOSIS — N184 Chronic kidney disease, stage 4 (severe): Secondary | ICD-10-CM | POA: Diagnosis not present

## 2017-07-05 DIAGNOSIS — I10 Essential (primary) hypertension: Secondary | ICD-10-CM | POA: Diagnosis not present

## 2017-07-05 DIAGNOSIS — R0989 Other specified symptoms and signs involving the circulatory and respiratory systems: Secondary | ICD-10-CM | POA: Diagnosis not present

## 2017-07-05 DIAGNOSIS — D539 Nutritional anemia, unspecified: Secondary | ICD-10-CM | POA: Diagnosis not present

## 2017-07-05 DIAGNOSIS — R29898 Other symptoms and signs involving the musculoskeletal system: Secondary | ICD-10-CM | POA: Diagnosis not present

## 2017-07-05 DIAGNOSIS — R42 Dizziness and giddiness: Secondary | ICD-10-CM | POA: Diagnosis not present

## 2017-07-06 DIAGNOSIS — I10 Essential (primary) hypertension: Secondary | ICD-10-CM | POA: Diagnosis not present

## 2017-07-06 DIAGNOSIS — I2510 Atherosclerotic heart disease of native coronary artery without angina pectoris: Secondary | ICD-10-CM | POA: Diagnosis not present

## 2017-07-06 DIAGNOSIS — I6523 Occlusion and stenosis of bilateral carotid arteries: Secondary | ICD-10-CM | POA: Diagnosis not present

## 2017-07-06 DIAGNOSIS — R29898 Other symptoms and signs involving the musculoskeletal system: Secondary | ICD-10-CM | POA: Diagnosis not present

## 2017-07-06 DIAGNOSIS — N184 Chronic kidney disease, stage 4 (severe): Secondary | ICD-10-CM | POA: Diagnosis not present

## 2017-07-06 DIAGNOSIS — D539 Nutritional anemia, unspecified: Secondary | ICD-10-CM | POA: Diagnosis not present

## 2017-07-07 DIAGNOSIS — M48061 Spinal stenosis, lumbar region without neurogenic claudication: Secondary | ICD-10-CM | POA: Diagnosis not present

## 2017-07-07 DIAGNOSIS — D539 Nutritional anemia, unspecified: Secondary | ICD-10-CM | POA: Diagnosis not present

## 2017-07-07 DIAGNOSIS — Z23 Encounter for immunization: Secondary | ICD-10-CM | POA: Diagnosis not present

## 2017-07-07 DIAGNOSIS — R42 Dizziness and giddiness: Secondary | ICD-10-CM | POA: Diagnosis not present

## 2017-07-07 DIAGNOSIS — Q631 Lobulated, fused and horseshoe kidney: Secondary | ICD-10-CM | POA: Diagnosis not present

## 2017-07-07 DIAGNOSIS — I2510 Atherosclerotic heart disease of native coronary artery without angina pectoris: Secondary | ICD-10-CM | POA: Diagnosis not present

## 2017-07-07 DIAGNOSIS — Z791 Long term (current) use of non-steroidal anti-inflammatories (NSAID): Secondary | ICD-10-CM | POA: Diagnosis not present

## 2017-07-07 DIAGNOSIS — I2583 Coronary atherosclerosis due to lipid rich plaque: Secondary | ICD-10-CM | POA: Diagnosis present

## 2017-07-07 DIAGNOSIS — J189 Pneumonia, unspecified organism: Secondary | ICD-10-CM | POA: Diagnosis not present

## 2017-07-07 DIAGNOSIS — R200 Anesthesia of skin: Secondary | ICD-10-CM | POA: Diagnosis present

## 2017-07-07 DIAGNOSIS — N401 Enlarged prostate with lower urinary tract symptoms: Secondary | ICD-10-CM | POA: Diagnosis present

## 2017-07-07 DIAGNOSIS — R5381 Other malaise: Secondary | ICD-10-CM | POA: Diagnosis present

## 2017-07-07 DIAGNOSIS — I714 Abdominal aortic aneurysm, without rupture: Secondary | ICD-10-CM | POA: Diagnosis present

## 2017-07-07 DIAGNOSIS — R0902 Hypoxemia: Secondary | ICD-10-CM | POA: Diagnosis not present

## 2017-07-07 DIAGNOSIS — I5022 Chronic systolic (congestive) heart failure: Secondary | ICD-10-CM | POA: Diagnosis present

## 2017-07-07 DIAGNOSIS — I130 Hypertensive heart and chronic kidney disease with heart failure and stage 1 through stage 4 chronic kidney disease, or unspecified chronic kidney disease: Secondary | ICD-10-CM | POA: Diagnosis not present

## 2017-07-07 DIAGNOSIS — I10 Essential (primary) hypertension: Secondary | ICD-10-CM | POA: Diagnosis not present

## 2017-07-07 DIAGNOSIS — Z79899 Other long term (current) drug therapy: Secondary | ICD-10-CM | POA: Diagnosis not present

## 2017-07-07 DIAGNOSIS — J45909 Unspecified asthma, uncomplicated: Secondary | ICD-10-CM | POA: Diagnosis not present

## 2017-07-07 DIAGNOSIS — M5126 Other intervertebral disc displacement, lumbar region: Secondary | ICD-10-CM | POA: Diagnosis not present

## 2017-07-07 DIAGNOSIS — R531 Weakness: Secondary | ICD-10-CM | POA: Diagnosis present

## 2017-07-07 DIAGNOSIS — N184 Chronic kidney disease, stage 4 (severe): Secondary | ICD-10-CM | POA: Diagnosis not present

## 2017-07-07 DIAGNOSIS — R29898 Other symptoms and signs involving the musculoskeletal system: Secondary | ICD-10-CM | POA: Diagnosis not present

## 2017-07-07 DIAGNOSIS — Z7902 Long term (current) use of antithrombotics/antiplatelets: Secondary | ICD-10-CM | POA: Diagnosis not present

## 2017-07-09 DIAGNOSIS — N184 Chronic kidney disease, stage 4 (severe): Secondary | ICD-10-CM | POA: Diagnosis not present

## 2017-07-09 DIAGNOSIS — N401 Enlarged prostate with lower urinary tract symptoms: Secondary | ICD-10-CM | POA: Diagnosis not present

## 2017-07-09 DIAGNOSIS — M6281 Muscle weakness (generalized): Secondary | ICD-10-CM | POA: Diagnosis not present

## 2017-07-09 DIAGNOSIS — I130 Hypertensive heart and chronic kidney disease with heart failure and stage 1 through stage 4 chronic kidney disease, or unspecified chronic kidney disease: Secondary | ICD-10-CM | POA: Diagnosis not present

## 2017-07-09 DIAGNOSIS — I5022 Chronic systolic (congestive) heart failure: Secondary | ICD-10-CM | POA: Diagnosis not present

## 2017-07-09 DIAGNOSIS — R2681 Unsteadiness on feet: Secondary | ICD-10-CM | POA: Diagnosis not present

## 2017-07-14 DIAGNOSIS — M8580 Other specified disorders of bone density and structure, unspecified site: Secondary | ICD-10-CM | POA: Diagnosis not present

## 2017-07-14 DIAGNOSIS — I130 Hypertensive heart and chronic kidney disease with heart failure and stage 1 through stage 4 chronic kidney disease, or unspecified chronic kidney disease: Secondary | ICD-10-CM | POA: Diagnosis not present

## 2017-07-14 DIAGNOSIS — N401 Enlarged prostate with lower urinary tract symptoms: Secondary | ICD-10-CM | POA: Diagnosis not present

## 2017-07-14 DIAGNOSIS — Z6828 Body mass index (BMI) 28.0-28.9, adult: Secondary | ICD-10-CM | POA: Diagnosis not present

## 2017-07-14 DIAGNOSIS — I10 Essential (primary) hypertension: Secondary | ICD-10-CM | POA: Diagnosis not present

## 2017-07-14 DIAGNOSIS — R2681 Unsteadiness on feet: Secondary | ICD-10-CM | POA: Diagnosis not present

## 2017-07-14 DIAGNOSIS — Z0000 Encounter for general adult medical examination without abnormal findings: Secondary | ICD-10-CM | POA: Diagnosis not present

## 2017-07-14 DIAGNOSIS — I5022 Chronic systolic (congestive) heart failure: Secondary | ICD-10-CM | POA: Diagnosis not present

## 2017-07-14 DIAGNOSIS — N184 Chronic kidney disease, stage 4 (severe): Secondary | ICD-10-CM | POA: Diagnosis not present

## 2017-07-14 DIAGNOSIS — Z13820 Encounter for screening for osteoporosis: Secondary | ICD-10-CM | POA: Diagnosis not present

## 2017-07-14 DIAGNOSIS — M859 Disorder of bone density and structure, unspecified: Secondary | ICD-10-CM | POA: Diagnosis not present

## 2017-07-14 DIAGNOSIS — M6281 Muscle weakness (generalized): Secondary | ICD-10-CM | POA: Diagnosis not present

## 2017-07-14 DIAGNOSIS — E785 Hyperlipidemia, unspecified: Secondary | ICD-10-CM | POA: Diagnosis not present

## 2017-07-15 DIAGNOSIS — I519 Heart disease, unspecified: Secondary | ICD-10-CM

## 2017-07-15 HISTORY — DX: Heart disease, unspecified: I51.9

## 2017-07-17 DIAGNOSIS — H353131 Nonexudative age-related macular degeneration, bilateral, early dry stage: Secondary | ICD-10-CM | POA: Diagnosis not present

## 2017-07-18 DIAGNOSIS — N401 Enlarged prostate with lower urinary tract symptoms: Secondary | ICD-10-CM | POA: Diagnosis not present

## 2017-07-18 DIAGNOSIS — N184 Chronic kidney disease, stage 4 (severe): Secondary | ICD-10-CM | POA: Diagnosis not present

## 2017-07-18 DIAGNOSIS — I130 Hypertensive heart and chronic kidney disease with heart failure and stage 1 through stage 4 chronic kidney disease, or unspecified chronic kidney disease: Secondary | ICD-10-CM | POA: Diagnosis not present

## 2017-07-18 DIAGNOSIS — R2681 Unsteadiness on feet: Secondary | ICD-10-CM | POA: Diagnosis not present

## 2017-07-18 DIAGNOSIS — I5022 Chronic systolic (congestive) heart failure: Secondary | ICD-10-CM | POA: Diagnosis not present

## 2017-07-18 DIAGNOSIS — M6281 Muscle weakness (generalized): Secondary | ICD-10-CM | POA: Diagnosis not present

## 2017-07-21 DIAGNOSIS — R2681 Unsteadiness on feet: Secondary | ICD-10-CM | POA: Diagnosis not present

## 2017-07-22 DIAGNOSIS — I5022 Chronic systolic (congestive) heart failure: Secondary | ICD-10-CM | POA: Diagnosis not present

## 2017-07-22 DIAGNOSIS — M6281 Muscle weakness (generalized): Secondary | ICD-10-CM | POA: Diagnosis not present

## 2017-07-22 DIAGNOSIS — N401 Enlarged prostate with lower urinary tract symptoms: Secondary | ICD-10-CM | POA: Diagnosis not present

## 2017-07-22 DIAGNOSIS — I130 Hypertensive heart and chronic kidney disease with heart failure and stage 1 through stage 4 chronic kidney disease, or unspecified chronic kidney disease: Secondary | ICD-10-CM | POA: Diagnosis not present

## 2017-07-22 DIAGNOSIS — N184 Chronic kidney disease, stage 4 (severe): Secondary | ICD-10-CM | POA: Diagnosis not present

## 2017-07-22 DIAGNOSIS — R2681 Unsteadiness on feet: Secondary | ICD-10-CM | POA: Diagnosis not present

## 2017-07-24 DIAGNOSIS — M6281 Muscle weakness (generalized): Secondary | ICD-10-CM | POA: Diagnosis not present

## 2017-07-24 DIAGNOSIS — I5022 Chronic systolic (congestive) heart failure: Secondary | ICD-10-CM | POA: Diagnosis not present

## 2017-07-24 DIAGNOSIS — N401 Enlarged prostate with lower urinary tract symptoms: Secondary | ICD-10-CM | POA: Diagnosis not present

## 2017-07-24 DIAGNOSIS — N184 Chronic kidney disease, stage 4 (severe): Secondary | ICD-10-CM | POA: Diagnosis not present

## 2017-07-24 DIAGNOSIS — R2681 Unsteadiness on feet: Secondary | ICD-10-CM | POA: Diagnosis not present

## 2017-07-24 DIAGNOSIS — I130 Hypertensive heart and chronic kidney disease with heart failure and stage 1 through stage 4 chronic kidney disease, or unspecified chronic kidney disease: Secondary | ICD-10-CM | POA: Diagnosis not present

## 2017-09-02 DIAGNOSIS — K210 Gastro-esophageal reflux disease with esophagitis: Secondary | ICD-10-CM | POA: Diagnosis not present

## 2017-10-09 DIAGNOSIS — Z1339 Encounter for screening examination for other mental health and behavioral disorders: Secondary | ICD-10-CM | POA: Diagnosis not present

## 2017-10-09 DIAGNOSIS — R0781 Pleurodynia: Secondary | ICD-10-CM | POA: Diagnosis not present

## 2017-10-09 DIAGNOSIS — Z6829 Body mass index (BMI) 29.0-29.9, adult: Secondary | ICD-10-CM | POA: Diagnosis not present

## 2017-10-20 DIAGNOSIS — C44319 Basal cell carcinoma of skin of other parts of face: Secondary | ICD-10-CM | POA: Diagnosis not present

## 2017-10-20 DIAGNOSIS — L578 Other skin changes due to chronic exposure to nonionizing radiation: Secondary | ICD-10-CM | POA: Diagnosis not present

## 2017-10-20 DIAGNOSIS — L820 Inflamed seborrheic keratosis: Secondary | ICD-10-CM | POA: Diagnosis not present

## 2017-10-20 DIAGNOSIS — L821 Other seborrheic keratosis: Secondary | ICD-10-CM | POA: Diagnosis not present

## 2017-10-20 DIAGNOSIS — L570 Actinic keratosis: Secondary | ICD-10-CM | POA: Diagnosis not present

## 2017-10-28 DIAGNOSIS — Z6828 Body mass index (BMI) 28.0-28.9, adult: Secondary | ICD-10-CM | POA: Diagnosis not present

## 2017-10-28 DIAGNOSIS — M25512 Pain in left shoulder: Secondary | ICD-10-CM | POA: Diagnosis not present

## 2017-10-31 DIAGNOSIS — M25512 Pain in left shoulder: Secondary | ICD-10-CM | POA: Diagnosis not present

## 2017-10-31 DIAGNOSIS — M542 Cervicalgia: Secondary | ICD-10-CM | POA: Diagnosis not present

## 2017-10-31 DIAGNOSIS — M25612 Stiffness of left shoulder, not elsewhere classified: Secondary | ICD-10-CM | POA: Diagnosis not present

## 2017-11-07 DIAGNOSIS — M542 Cervicalgia: Secondary | ICD-10-CM | POA: Diagnosis not present

## 2017-11-07 DIAGNOSIS — M25612 Stiffness of left shoulder, not elsewhere classified: Secondary | ICD-10-CM | POA: Diagnosis not present

## 2017-11-07 DIAGNOSIS — M25512 Pain in left shoulder: Secondary | ICD-10-CM | POA: Diagnosis not present

## 2017-11-14 DIAGNOSIS — E785 Hyperlipidemia, unspecified: Secondary | ICD-10-CM | POA: Diagnosis not present

## 2017-11-14 DIAGNOSIS — Z6828 Body mass index (BMI) 28.0-28.9, adult: Secondary | ICD-10-CM | POA: Diagnosis not present

## 2017-11-14 DIAGNOSIS — I10 Essential (primary) hypertension: Secondary | ICD-10-CM | POA: Diagnosis not present

## 2017-11-14 DIAGNOSIS — R5383 Other fatigue: Secondary | ICD-10-CM | POA: Diagnosis not present

## 2017-11-14 DIAGNOSIS — Z79899 Other long term (current) drug therapy: Secondary | ICD-10-CM | POA: Diagnosis not present

## 2018-01-15 DIAGNOSIS — H353131 Nonexudative age-related macular degeneration, bilateral, early dry stage: Secondary | ICD-10-CM | POA: Diagnosis not present

## 2018-01-19 DIAGNOSIS — N2581 Secondary hyperparathyroidism of renal origin: Secondary | ICD-10-CM | POA: Diagnosis not present

## 2018-01-19 DIAGNOSIS — N183 Chronic kidney disease, stage 3 (moderate): Secondary | ICD-10-CM | POA: Diagnosis not present

## 2018-01-19 DIAGNOSIS — D638 Anemia in other chronic diseases classified elsewhere: Secondary | ICD-10-CM | POA: Diagnosis not present

## 2018-01-28 DIAGNOSIS — N183 Chronic kidney disease, stage 3 (moderate): Secondary | ICD-10-CM | POA: Diagnosis not present

## 2018-01-28 DIAGNOSIS — N2581 Secondary hyperparathyroidism of renal origin: Secondary | ICD-10-CM | POA: Diagnosis not present

## 2018-01-28 DIAGNOSIS — D638 Anemia in other chronic diseases classified elsewhere: Secondary | ICD-10-CM | POA: Diagnosis not present

## 2018-01-28 DIAGNOSIS — I129 Hypertensive chronic kidney disease with stage 1 through stage 4 chronic kidney disease, or unspecified chronic kidney disease: Secondary | ICD-10-CM | POA: Diagnosis not present

## 2018-02-17 DIAGNOSIS — C44319 Basal cell carcinoma of skin of other parts of face: Secondary | ICD-10-CM | POA: Diagnosis not present

## 2018-02-17 DIAGNOSIS — L304 Erythema intertrigo: Secondary | ICD-10-CM | POA: Diagnosis not present

## 2018-02-17 DIAGNOSIS — L570 Actinic keratosis: Secondary | ICD-10-CM | POA: Diagnosis not present

## 2018-02-17 DIAGNOSIS — L821 Other seborrheic keratosis: Secondary | ICD-10-CM | POA: Diagnosis not present

## 2018-02-17 DIAGNOSIS — C44222 Squamous cell carcinoma of skin of right ear and external auricular canal: Secondary | ICD-10-CM | POA: Diagnosis not present

## 2018-02-18 DIAGNOSIS — J302 Other seasonal allergic rhinitis: Secondary | ICD-10-CM | POA: Diagnosis not present

## 2018-02-18 DIAGNOSIS — Z6828 Body mass index (BMI) 28.0-28.9, adult: Secondary | ICD-10-CM | POA: Diagnosis not present

## 2018-04-21 DIAGNOSIS — Z951 Presence of aortocoronary bypass graft: Secondary | ICD-10-CM | POA: Diagnosis not present

## 2018-04-21 DIAGNOSIS — I519 Heart disease, unspecified: Secondary | ICD-10-CM | POA: Diagnosis not present

## 2018-04-21 DIAGNOSIS — I350 Nonrheumatic aortic (valve) stenosis: Secondary | ICD-10-CM | POA: Diagnosis not present

## 2018-04-23 DIAGNOSIS — M7062 Trochanteric bursitis, left hip: Secondary | ICD-10-CM | POA: Diagnosis not present

## 2018-05-08 ENCOUNTER — Encounter: Payer: Self-pay | Admitting: Cardiology

## 2018-05-08 ENCOUNTER — Ambulatory Visit (INDEPENDENT_AMBULATORY_CARE_PROVIDER_SITE_OTHER): Payer: Medicare Other | Admitting: Cardiology

## 2018-05-08 VITALS — BP 144/76 | HR 57 | Ht 70.0 in | Wt 189.0 lb

## 2018-05-08 DIAGNOSIS — I2510 Atherosclerotic heart disease of native coronary artery without angina pectoris: Secondary | ICD-10-CM | POA: Diagnosis not present

## 2018-05-08 DIAGNOSIS — I350 Nonrheumatic aortic (valve) stenosis: Secondary | ICD-10-CM | POA: Diagnosis not present

## 2018-05-08 DIAGNOSIS — N184 Chronic kidney disease, stage 4 (severe): Secondary | ICD-10-CM

## 2018-05-08 DIAGNOSIS — I110 Hypertensive heart disease with heart failure: Secondary | ICD-10-CM

## 2018-05-08 DIAGNOSIS — E785 Hyperlipidemia, unspecified: Secondary | ICD-10-CM | POA: Diagnosis not present

## 2018-05-08 HISTORY — DX: Atherosclerotic heart disease of native coronary artery without angina pectoris: I25.10

## 2018-05-08 MED ORDER — FUROSEMIDE 40 MG PO TABS: 80.0000 mg | ORAL_TABLET | Freq: Every day | ORAL | 6 refills | Status: DC

## 2018-05-08 NOTE — Progress Notes (Signed)
Cardiology Office Note:    Date:  05/08/2018   ID:  Harry Klein, DOB November 28, 1921, MRN [id number]  PCP:  Patient, No Pcp Per  Cardiologist:  Shirlee More, MD    Referring MD: No ref. provider found    ASSESSMENT:    1. Hypertensive heart disease with heart failure ([place])   2. CAD in native artery   3. Chronic kidney disease, stage IV (severe) ([place])   4. Dyslipidemia   5. Nonrheumatic aortic valve stenosis    PLAN:    In order of problems listed above:  1. Decompensated he is fluid overloaded and will increase his diuretic to 80 mg of furosemide daily.  I asked to have a BMP and BNP performed. 2. Stable continue medical treatment for CAD clopidogrel beta-blocker and statin 3. Recheck renal function with diuretic therapy 4. Stable continue his low intensity statin lipids are checked with his PCP 5.          His examination suggest more than mild aortic stenosis, high risk for rapid progression recheck echocardiogram and if he had severe stenosis would be a candidate for TAVR  Next appointment: 6 months   Medication Adjustments/Labs and Tests Ordered: Current medicines are reviewed at length with the patient today.  Concerns regarding medicines are outlined above.  Orders Placed This Encounter  Procedures  . Basic metabolic panel  . Pro b natriuretic peptide (BNP)  . EKG 12-Lead  . ECHOCARDIOGRAM COMPLETE   Meds ordered this encounter  Medications  . furosemide (LASIX) 40 MG tablet    Sig: Take 2 tablets (80 mg total) by mouth daily.    Dispense:  60 tablet    Refill:  6    Chief Complaint  Patient presents with  . Coronary Artery Disease  . Congestive Heart Failure  . Aortic Stenosis  . Hypertension  . Chronic Kidney Disease  . Hyperlipidemia    History of Present Illness:    Harry Klein is a 84 y.o. male with a hx of CAD with CABG, heart failure with EF reported 25%, mild aortic stenosis, hypertension CKD stage III-IV and hyperlipidemia.  Last seen by me  09/23/16 at [facility] cardiology.  Last echocardiogram in care everywhere epic which I cannot see the report to see the physician's description 01/29/2016 however an echocardiogram is referenced from [facility] in 2018 with ejection fraction of 25%. He continues to live an independent living but finds himself short of breath with ADLs and walking indoors and has peripheral edema.  He has had no angina orthopnea palpitation or syncope.  He does not add salt to his food but is prepared and is compliant with medication. Compliance with diet, lifestyle and medications: Yes Past Medical History:  Diagnosis Date  . BPH (benign prostatic hyperplasia) 04/13/2014  . Cardiomyopathy ([place]) 07/10/2015   Overview:  Ejection fraction 40% in 2015  . CHF (congestive heart failure) ([place])   . Chronic kidney disease, stage IV (severe) ([place]) 03/08/2016  . Coronary artery disease   . Dyslipidemia 07/10/2015  . Endoleak post (EVAR) endovascular aneurysm repair, subsequent encounter 11/16/2015  . Hyperlipidemia   . Hypertension   . Hypertensive heart disease with heart failure ([place]) 02/22/2016  . LV dysfunction 07/15/2017   Overview:  Last EF 40%  . Nocturia 04/13/2014  . Nonrheumatic aortic valve stenosis 03/08/2016   Overview:  Mild  . Presence of aortocoronary bypass graft 07/10/2015   Overview:  Done in 2002  . Urge incontinence 04/13/2014  Past Surgical History:  Procedure Laterality Date  . CARDIAC CATHETERIZATION    . CORONARY ARTERY BYPASS GRAFT      Current Medications: Current Meds  Medication Sig  . clopidogrel (PLAVIX) 75 MG tablet Take 1 tablet by mouth daily.  . finasteride (PROSCAR) 5 MG tablet Take 1 tablet by mouth every other day.  . furosemide (LASIX) 40 MG tablet Take 2 tablets (80 mg total) by mouth daily.  . hydrALAZINE (APRESOLINE) 50 MG tablet Take 50 mg by mouth 3 (three) times daily.  . isosorbide dinitrate (ISORDIL) 20 MG tablet Take 1 tablet by mouth 3 (three) times daily.  .  lansoprazole (PREVACID) 30 MG capsule Take 1 capsule by mouth daily.  . metoprolol succinate (TOPROL-XL) 25 MG 24 hr tablet Take 1 tablet by mouth daily.  . polyethylene glycol powder (GLYCOLAX/MIRALAX) powder Take 17 g by mouth daily.  . pravastatin (PRAVACHOL) 80 MG tablet Take 1 tablet by mouth daily.  . silodosin (RAPAFLO) 8 MG CAPS capsule Take 1 capsule by mouth daily.  . Vitamin D, Ergocalciferol, (DRISDOL) 50000 units CAPS capsule Take 50,000 Units by mouth once a week.  . [DISCONTINUED] furosemide (LASIX) 40 MG tablet Take 1 tablet by mouth every other day.     Allergies:   Patient has no known allergies.   Social History   Socioeconomic History  . Marital status: Married    Spouse name: Not on file  . Number of children: Not on file  . Years of education: Not on file  . Highest education level: Not on file  Occupational History  . Not on file  Social Needs  . Financial resource strain: Not on file  . Food insecurity:    Worry: Not on file    Inability: Not on file  . Transportation needs:    Medical: Not on file    Non-medical: Not on file  Tobacco Use  . Smoking status: Former Research scientist (life sciences)  . Smokeless tobacco: Never Used  Substance and Sexual Activity  . Alcohol use: Not on file  . Drug use: Not on file  . Sexual activity: Not on file  Lifestyle  . Physical activity:    Days per week: Not on file    Minutes per session: Not on file  . Stress: Not on file  Relationships  . Social connections:    Talks on phone: Not on file    Gets together: Not on file    Attends religious service: Not on file    Active member of club or organization: Not on file    Attends meetings of clubs or organizations: Not on file    Relationship status: Not on file  Other Topics Concern  . Not on file  Social History Narrative  . Not on file     Family History: The patient's family history is negative for Heart attack, Heart disease, Stroke, Coronary artery disease, and Congestive  Heart Failure. ROS:   Please see the history of present illness.    All other systems reviewed and are negative.  EKGs/Labs/Other Studies Reviewed:    The following studies were reviewed today:  EKG:  EKG ordered today.  The ekg ordered today demonstrates sinus rhythm voltage for LVH otherwise normal minor nonspecific ST segment abnormality  Recent Labs: 05/22/2017 cholesterol 126 HDL  No results found for requested labs within last 8760 hours.  Recent Lipid Panel No results found for: CHOL, TRIG, HDL, CHOLHDL, VLDL, LDLCALC, LDLDIRECT  Physical Exam:    VS:  BP (!) 144/76 (BP Location: Right Arm, Patient Position: Sitting, Cuff Size: Normal)   Pulse (!) 57   Ht 5\' 10"  (1.778 m)   Wt 189 lb (85.7 kg)   SpO2 97%   BMI 27.12 kg/m     Wt Readings from Last 3 Encounters:  05/08/18 189 lb (85.7 kg)     GEN:  Well nourished, well developed in no acute distress HEENT: Normal NECK: No JVD; No carotid bruits LYMPHATICS: No lymphadenopathy CARDIAC: He has a grade 3/6 midsystolic peaking murmur aortic stenosis harsh radiates to right carotid base as well as the right clavicular area S2 still splits RRR, no murmurs, rubs, gallops RESPIRATORY:  Clear to auscultation without rales, wheezing or rhonchi  ABDOMEN: Soft, non-tender, non-distended MUSCULOSKELETAL: 2-3+ bilateral to the knee edema; No deformity  SKIN: Warm and dry NEUROLOGIC:  Alert and oriented x 3 PSYCHIATRIC:  Normal affect    Signed, Shirlee More, MD  05/08/2018 5:55 PM    [place]

## 2018-05-08 NOTE — Patient Instructions (Addendum)
Medication Instructions:  Your physician has recommended you make the following change in your medication:  START lasix 80 mg daily  Labwork: Your physician recommends that you have the following labs drawn: BNP and BMP  Testing/Procedures: Your physician has requested that you have an echocardiogram. Echocardiography is a painless test that uses sound waves to create images of your heart. It provides your doctor with information about the size and shape of your heart and how well your heart's chambers and valves are working. This procedure takes approximately one hour. There are no restrictions for this procedure.  Follow-Up: Your physician recommends that you schedule a follow-up appointment in: 6 months  Any Other Special Instructions Will Be Listed Below (If Applicable).     If you need a refill on your cardiac medications before your next appointment, please call your pharmacy.   [place], RN, BSN   KNOW YOUR HEART FAILURE ZONES  Green Zone (Your Goal):  Marland Kitchen No shortness of breath or trouble bleeding . No weight gain of more than 3 pounds in one day or 5 pounds in a week . No swelling in your ankles, feet, stomach, or hands . No chest discomfort, heaviness or pain  Yellow Zone (Call the Doctor to get help!): Having 1 or more of the following: . Weight gain of 3 pounds in 1 day or 5 pounds in 1 week . More swelling of your feet, ankles, stomach, or hands . It is harder to breath when lying down. You need to sit up . Chest discomfort, heaviness, or pain . You feel more tired or have less energy than normal . New or worsening dizziness . Dry hacking cough . You feel uneasy and you know something is not right  Red Zone (Call 911-EMERGENCY): . Struggling to breathe. This does not go away when you sit up. . Stronger and more regular amounts of chest discomfort . New confusion or can't think clearly . Fainting or near fainting   Resources form the  American heart Association: RetroDivas.ch.jsp

## 2018-05-09 LAB — BASIC METABOLIC PANEL
BUN/Creatinine Ratio: 17 (ref 10–24)
BUN: 36 mg/dL (ref 10–36)
CO2: 26 mmol/L (ref 20–29)
Calcium: 8.7 mg/dL (ref 8.6–10.2)
Chloride: 97 mmol/L (ref 96–106)
Creatinine, Ser: 2.09 mg/dL — ABNORMAL HIGH (ref 0.76–1.27)
GFR calc Af Amer: 30 mL/min/{1.73_m2} — ABNORMAL LOW (ref >59)
GFR calc non Af Amer: 26 mL/min/{1.73_m2} — ABNORMAL LOW (ref >59)
Glucose: 113 mg/dL — ABNORMAL HIGH (ref 65–99)
POTASSIUM: 4.2 mmol/L (ref 3.5–5.2)
Sodium: 139 mmol/L (ref 134–144)

## 2018-05-09 LAB — PRO B NATRIURETIC PEPTIDE: NT-Pro BNP: 3648 pg/mL — ABNORMAL HIGH (ref 0–486)

## 2018-05-11 ENCOUNTER — Telehealth: Payer: Self-pay

## 2018-05-11 DIAGNOSIS — I110 Hypertensive heart disease with heart failure: Secondary | ICD-10-CM

## 2018-05-11 DIAGNOSIS — N184 Chronic kidney disease, stage 4 (severe): Secondary | ICD-10-CM

## 2018-05-11 NOTE — Telephone Encounter (Signed)
Patient notified of abnormal lab results per Dr Bettina Gavia. Patient advised to recheck labs in 2 weeks 05-25-18.  Patient aware and agrees to plan.

## 2018-05-25 DIAGNOSIS — I110 Hypertensive heart disease with heart failure: Secondary | ICD-10-CM | POA: Diagnosis not present

## 2018-05-25 DIAGNOSIS — N184 Chronic kidney disease, stage 4 (severe): Secondary | ICD-10-CM | POA: Diagnosis not present

## 2018-05-25 LAB — BASIC METABOLIC PANEL
BUN / CREAT RATIO: 17 (ref 10–24)
BUN: 38 mg/dL — ABNORMAL HIGH (ref 10–36)
CO2: 26 mmol/L (ref 20–29)
Calcium: 8.8 mg/dL (ref 8.6–10.2)
Chloride: 96 mmol/L (ref 96–106)
Creatinine, Ser: 2.18 mg/dL — ABNORMAL HIGH (ref 0.76–1.27)
GFR calc non Af Amer: 25 mL/min/{1.73_m2} — ABNORMAL LOW (ref >59)
GFR, EST AFRICAN AMERICAN: 29 mL/min/{1.73_m2} — AB (ref >59)
Glucose: 95 mg/dL (ref 65–99)
POTASSIUM: 4.3 mmol/L (ref 3.5–5.2)
SODIUM: 137 mmol/L (ref 134–144)

## 2018-05-25 LAB — PRO B NATRIURETIC PEPTIDE: NT-Pro BNP: 2277 pg/mL — ABNORMAL HIGH (ref 0–486)

## 2018-05-26 ENCOUNTER — Telehealth: Payer: Self-pay

## 2018-05-26 DIAGNOSIS — I110 Hypertensive heart disease with heart failure: Secondary | ICD-10-CM

## 2018-05-26 DIAGNOSIS — N184 Chronic kidney disease, stage 4 (severe): Secondary | ICD-10-CM

## 2018-05-26 NOTE — Telephone Encounter (Signed)
-----   Message from Richardo Priest, MD sent at 05/25/2018  5:15 PM EDT ----- Normal or stable result  BNP improved, recheck Bmp 1 week with elevated Cr

## 2018-05-26 NOTE — Telephone Encounter (Signed)
Denise Durrand notified of improved BNP and to recheck BMP in 1 week to evaluate elevated creatinine level. Langley Gauss verbalized understanding and agrees to plan.

## 2018-06-02 DIAGNOSIS — I110 Hypertensive heart disease with heart failure: Secondary | ICD-10-CM | POA: Diagnosis not present

## 2018-06-02 DIAGNOSIS — N184 Chronic kidney disease, stage 4 (severe): Secondary | ICD-10-CM | POA: Diagnosis not present

## 2018-06-03 ENCOUNTER — Telehealth: Payer: Self-pay | Admitting: Emergency Medicine

## 2018-06-03 ENCOUNTER — Other Ambulatory Visit: Payer: Self-pay | Admitting: Emergency Medicine

## 2018-06-03 DIAGNOSIS — N184 Chronic kidney disease, stage 4 (severe): Secondary | ICD-10-CM

## 2018-06-03 LAB — BASIC METABOLIC PANEL
BUN/Creatinine Ratio: 17 (ref 10–24)
BUN: 38 mg/dL — AB (ref 10–36)
CHLORIDE: 97 mmol/L (ref 96–106)
CO2: 27 mmol/L (ref 20–29)
CREATININE: 2.25 mg/dL — AB (ref 0.76–1.27)
Calcium: 8.8 mg/dL (ref 8.6–10.2)
GFR, EST AFRICAN AMERICAN: 28 mL/min/{1.73_m2} — AB (ref >59)
GFR, EST NON AFRICAN AMERICAN: 24 mL/min/{1.73_m2} — AB (ref >59)
Glucose: 101 mg/dL — ABNORMAL HIGH (ref 65–99)
Potassium: 3.7 mmol/L (ref 3.5–5.2)
Sodium: 143 mmol/L (ref 134–144)

## 2018-06-03 NOTE — Telephone Encounter (Signed)
Verified with patient's daughter that patient needs 90 day supply of medications when refilled. She also was asking about lab results. I informed her that these would be given once the doctor sends [place] a result note.

## 2018-06-05 DIAGNOSIS — N401 Enlarged prostate with lower urinary tract symptoms: Secondary | ICD-10-CM | POA: Diagnosis not present

## 2018-06-05 DIAGNOSIS — N138 Other obstructive and reflux uropathy: Secondary | ICD-10-CM | POA: Diagnosis not present

## 2018-06-17 ENCOUNTER — Ambulatory Visit (INDEPENDENT_AMBULATORY_CARE_PROVIDER_SITE_OTHER): Payer: Medicare Other

## 2018-06-17 ENCOUNTER — Other Ambulatory Visit: Payer: Self-pay

## 2018-06-17 DIAGNOSIS — I110 Hypertensive heart disease with heart failure: Secondary | ICD-10-CM

## 2018-06-17 DIAGNOSIS — I2510 Atherosclerotic heart disease of native coronary artery without angina pectoris: Secondary | ICD-10-CM | POA: Diagnosis not present

## 2018-06-17 NOTE — Progress Notes (Signed)
Complete echocardiogram has been performed.  Jimmy Dainel Arcidiacono RDCS 

## 2018-06-23 ENCOUNTER — Telehealth: Payer: Self-pay | Admitting: Cardiology

## 2018-06-23 MED ORDER — ISOSORBIDE DINITRATE 20 MG PO TABS: 20.0000 mg | ORAL_TABLET | Freq: Three times a day (TID) | ORAL | 3 refills | Status: DC

## 2018-06-23 NOTE — Telephone Encounter (Signed)
°*  STAT* If patient is at the pharmacy, call can be transferred to refill team.   1. Which medications need to be refilled? (please list name of each medication and dose if known) Isosorbide   2. Which pharmacy/location (including street and city if local pharmacy) is medication to be sent to? [facility]  3. Do they need a 30 day or 90 day supply? [place]

## 2018-06-23 NOTE — Telephone Encounter (Signed)
rx sent as requested.

## 2018-07-07 ENCOUNTER — Other Ambulatory Visit: Payer: Self-pay | Admitting: Emergency Medicine

## 2018-07-07 DIAGNOSIS — N184 Chronic kidney disease, stage 4 (severe): Secondary | ICD-10-CM | POA: Diagnosis not present

## 2018-07-08 LAB — BASIC METABOLIC PANEL
BUN/Creatinine Ratio: 18 (ref 10–24)
BUN: 37 mg/dL — ABNORMAL HIGH (ref 10–36)
CALCIUM: 8.8 mg/dL (ref 8.6–10.2)
CO2: 28 mmol/L (ref 20–29)
Chloride: 96 mmol/L (ref 96–106)
Creatinine, Ser: 2.02 mg/dL — ABNORMAL HIGH (ref 0.76–1.27)
GFR calc Af Amer: 32 mL/min/{1.73_m2} — ABNORMAL LOW (ref >59)
GFR calc non Af Amer: 27 mL/min/{1.73_m2} — ABNORMAL LOW (ref >59)
Glucose: 95 mg/dL (ref 65–99)
POTASSIUM: 4 mmol/L (ref 3.5–5.2)
SODIUM: 141 mmol/L (ref 134–144)

## 2018-07-16 DIAGNOSIS — Z23 Encounter for immunization: Secondary | ICD-10-CM | POA: Diagnosis not present

## 2018-07-20 DIAGNOSIS — N183 Chronic kidney disease, stage 3 (moderate): Secondary | ICD-10-CM | POA: Diagnosis not present

## 2018-07-27 DIAGNOSIS — N2581 Secondary hyperparathyroidism of renal origin: Secondary | ICD-10-CM | POA: Diagnosis not present

## 2018-07-27 DIAGNOSIS — N183 Chronic kidney disease, stage 3 (moderate): Secondary | ICD-10-CM | POA: Diagnosis not present

## 2018-07-27 DIAGNOSIS — I129 Hypertensive chronic kidney disease with stage 1 through stage 4 chronic kidney disease, or unspecified chronic kidney disease: Secondary | ICD-10-CM | POA: Diagnosis not present

## 2018-07-27 DIAGNOSIS — D638 Anemia in other chronic diseases classified elsewhere: Secondary | ICD-10-CM | POA: Diagnosis not present

## 2018-08-24 ENCOUNTER — Other Ambulatory Visit: Payer: Self-pay

## 2018-08-24 DIAGNOSIS — L820 Inflamed seborrheic keratosis: Secondary | ICD-10-CM | POA: Diagnosis not present

## 2018-08-24 DIAGNOSIS — L821 Other seborrheic keratosis: Secondary | ICD-10-CM | POA: Diagnosis not present

## 2018-08-24 DIAGNOSIS — L578 Other skin changes due to chronic exposure to nonionizing radiation: Secondary | ICD-10-CM | POA: Diagnosis not present

## 2018-08-24 DIAGNOSIS — C44212 Basal cell carcinoma of skin of right ear and external auricular canal: Secondary | ICD-10-CM | POA: Diagnosis not present

## 2018-08-24 DIAGNOSIS — L570 Actinic keratosis: Secondary | ICD-10-CM | POA: Diagnosis not present

## 2018-09-08 DIAGNOSIS — H353133 Nonexudative age-related macular degeneration, bilateral, advanced atrophic without subfoveal involvement: Secondary | ICD-10-CM | POA: Diagnosis not present

## 2018-11-26 DIAGNOSIS — L821 Other seborrheic keratosis: Secondary | ICD-10-CM | POA: Diagnosis not present

## 2018-11-26 DIAGNOSIS — L570 Actinic keratosis: Secondary | ICD-10-CM | POA: Diagnosis not present

## 2018-11-26 DIAGNOSIS — L578 Other skin changes due to chronic exposure to nonionizing radiation: Secondary | ICD-10-CM | POA: Diagnosis not present

## 2018-11-26 DIAGNOSIS — C44222 Squamous cell carcinoma of skin of right ear and external auricular canal: Secondary | ICD-10-CM | POA: Diagnosis not present

## 2018-11-26 NOTE — Progress Notes (Signed)
Cardiology Office Note:    Date:  11/27/2018   ID:  Harry Klein, DOB 21-Aug-1922, MRN [id number]  PCP:  Patient, No Pcp Per  Cardiologist:  Shirlee More, MD    Referring MD: No ref. provider found    ASSESSMENT:    1. CAD in native artery   2. Hypertensive heart disease with heart failure ([place])   3. Ischemic cardiomyopathy   4. Nonrheumatic aortic valve stenosis   5. Chronic kidney disease, stage IV (severe) ([place])   6. Dyslipidemia   7. Shortness of breath    PLAN:    In order of problems listed above:  1. Stable CAD New York Heart Association class I remote bypass continue medical treatment including clopidogrel beta-blocker and statin 2. Stable continue current treatment including vasodilator isosorbide and hydralazine 3. Improved ejection fraction is normalized continue guideline directed therapy 4. Clinically stable plan to recheck echocardiogram in September he has mild to moderate be unlikely to progress to severe in 1 calendar year 5. Recheck renal function today 6. Stable continue statin check liver function and lipid profile   Next appointment: 6 months   Medication Adjustments/Labs and Tests Ordered: Current medicines are reviewed at length with the patient today.  Concerns regarding medicines are outlined above.  Orders Placed This Encounter  Procedures  . Comp Met (CMET)  . Lipid Profile  . Pro b natriuretic peptide (BNP)   No orders of the defined types were placed in this encounter.   Chief Complaint  Patient presents with  . Follow-up    for   . Congestive Heart Failure  . Aortic Stenosis  . Coronary Artery Disease  . Hypertension  . Hyperlipidemia    History of Present Illness:    Harry Klein is a 85 y.o. male with a hx of CAD with CABG, heart failure with EF reported 25%, mild aortic stenosis, hypertension CKD stage III-IV and hyperlipidemia  last seen 05/08/18.  Subsequent echocardiogram performed September 2019 showed a market  improvement normal left ventricular size and the ejection fraction had normalized at that time  Echo 06/17/18: Study Conclusions - Left ventricle: The cavity size was normal. Systolic function was   normal. The estimated ejection fraction was in the range of 55%   to 60%. Wall motion was normal; there were no regional wall   motion abnormalities. Features are consistent with a pseudonormal   left ventricular filling pattern, with concomitant abnormal   relaxation and increased filling pressure (grade 2 diastolic   dysfunction). - Aortic valve: There was mild to moderate stenosis. There was   trivial regurgitation. Valve area (VTI): 1.11 cm^2. Valve area   (Vmax): 1.23 cm^2. Valve area (Vmean): 1.01 cm^2. - Mitral valve: There was mild regurgitation. - Left atrium: The atrium was moderately dilated. - Pulmonary arteries: PA peak pressure: 35 mm Hg (S). Impressions: - Mild to moderate AS.   Normal LVEF.  Compliance with diet, lifestyle and medications: Yes  He is supervised by his daughter he is doing well he is short of breath when he bends over or walks outdoors but his symptoms have not progressed he has had no chest pain or syncope no edema orthopnea.  His daughter thinks that he is doing well uses a pillbox for medications weighs daily and does not vary more than 1 pound up-and-down is actually down 8 pounds from his last visit and fully sodium restrict.  His CAD is stable he has had no anginal discomfort.  Daughter is present  and we are coordinating care through her Past Medical History:  Diagnosis Date  . BPH (benign prostatic hyperplasia) 04/13/2014  . Cardiomyopathy ([place]) 07/10/2015   Overview:  Ejection fraction 40% in 2015  . CHF (congestive heart failure) ([place])   . Chronic kidney disease, stage IV (severe) ([place]) 03/08/2016  . Coronary artery disease   . Dyslipidemia 07/10/2015  . Endoleak post (EVAR) endovascular aneurysm repair, subsequent encounter 11/16/2015  . Hyperlipidemia     . Hypertension   . Hypertensive heart disease with heart failure ([place]) 02/22/2016  . LV dysfunction 07/15/2017   Overview:  Last EF 40%  . Nocturia 04/13/2014  . Nonrheumatic aortic valve stenosis 03/08/2016   Overview:  Mild  . Presence of aortocoronary bypass graft 07/10/2015   Overview:  Done in 2002  . Urge incontinence 04/13/2014    Past Surgical History:  Procedure Laterality Date  . CARDIAC CATHETERIZATION    . CORONARY ARTERY BYPASS GRAFT      Current Medications: Current Meds  Medication Sig  . clopidogrel (PLAVIX) 75 MG tablet Take 1 tablet by mouth daily.  . cyanocobalamin (,VITAMIN B-12,) 1000 MCG/ML injection Inject 1,000 mcg into the skin every 30 (thirty) days.  . finasteride (PROSCAR) 5 MG tablet Take 1 tablet by mouth every other day.  . furosemide (LASIX) 40 MG tablet Take 2 tablets (80 mg total) by mouth daily.  . hydrALAZINE (APRESOLINE) 50 MG tablet Take 50 mg by mouth 3 (three) times daily.  . isosorbide dinitrate (ISORDIL) 20 MG tablet Take 1 tablet (20 mg total) by mouth 3 (three) times daily.  . metoprolol succinate (TOPROL-XL) 25 MG 24 hr tablet Take 1 tablet by mouth daily.  . Multiple Vitamins-Minerals (PRESERVISION AREDS 2 PO) Take 1 tablet by mouth daily.  . polyethylene glycol powder (GLYCOLAX/MIRALAX) powder Take 17 g by mouth daily.  . pravastatin (PRAVACHOL) 80 MG tablet Take 1 tablet by mouth daily.  Marland Kitchen pyridOXINE (VITAMIN B-6) 100 MG tablet Take 100 mg by mouth daily.  . silodosin (RAPAFLO) 8 MG CAPS capsule Take 1 capsule by mouth daily.  . Vitamin D, Ergocalciferol, (DRISDOL) 50000 units CAPS capsule Take 50,000 Units by mouth once a week.     Allergies:   Patient has no known allergies.   Social History   Socioeconomic History  . Marital status: Married    Spouse name: Not on file  . Number of children: Not on file  . Years of education: Not on file  . Highest education level: Not on file  Occupational History  . Not on file  Social Needs   . Financial resource strain: Not on file  . Food insecurity:    Worry: Not on file    Inability: Not on file  . Transportation needs:    Medical: Not on file    Non-medical: Not on file  Tobacco Use  . Smoking status: Former Smoker    Types: Cigarettes  . Smokeless tobacco: Never Used  Substance and Sexual Activity  . Alcohol use: Never    Frequency: Never  . Drug use: Never  . Sexual activity: Not on file  Lifestyle  . Physical activity:    Days per week: Not on file    Minutes per session: Not on file  . Stress: Not on file  Relationships  . Social connections:    Talks on phone: Not on file    Gets together: Not on file    Attends religious service: Not on file  Active member of club or organization: Not on file    Attends meetings of clubs or organizations: Not on file    Relationship status: Not on file  Other Topics Concern  . Not on file  Social History Narrative  . Not on file     Family History: The patient's family history is negative for Heart attack, Heart disease, Stroke, Coronary artery disease, and Congestive Heart Failure. ROS:   Please see the history of present illness.    All other systems reviewed and are negative.  EKGs/Labs/Other Studies Reviewed:    The following studies were reviewed today:  Recent Labs: 05/25/2018: NT-Pro BNP 2,277 07/07/2018: BUN 37; Creatinine, Ser 2.02; Potassium 4.0; Sodium 141  Recent Lipid Panel No results found for: CHOL, TRIG, HDL, CHOLHDL, VLDL, LDLCALC, LDLDIRECT  Physical Exam:    VS:  BP (!) 154/64 (BP Location: Right Arm, Patient Position: Sitting, Cuff Size: Normal)   Pulse (!) 58   Ht '5\' 10"'$  (1.778 m)   Wt 186 lb (84.4 kg)   SpO2 96%   BMI 26.69 kg/m     Wt Readings from Last 3 Encounters:  11/27/18 186 lb (84.4 kg)  05/08/18 189 lb (85.7 kg)     GEN:  Well nourished, well developed in no acute distress HEENT: Normal NECK: No JVD; No carotid bruits LYMPHATICS: No lymphadenopathy CARDIAC:  He has a harsh grade 3/6 mid peaking aortic outflow murmur radiates up to the right clavicle S2 is intact does not go the carotids no AR no S3 RRR, no, rubs, gallops RESPIRATORY:  Clear to auscultation without rales, wheezing or rhonchi  ABDOMEN: Soft, non-tender, non-distended MUSCULOSKELETAL:  No edema; No deformity  SKIN: Warm and dry NEUROLOGIC:  Alert and oriented x 3 PSYCHIATRIC:  Normal affect    Signed, Shirlee More, MD  11/27/2018 10:37 AM    [place]

## 2018-11-27 ENCOUNTER — Ambulatory Visit (INDEPENDENT_AMBULATORY_CARE_PROVIDER_SITE_OTHER): Payer: Medicare Other | Admitting: Cardiology

## 2018-11-27 ENCOUNTER — Encounter: Payer: Self-pay | Admitting: Cardiology

## 2018-11-27 VITALS — BP 154/64 | HR 58 | Ht 70.0 in | Wt 186.0 lb

## 2018-11-27 DIAGNOSIS — I255 Ischemic cardiomyopathy: Secondary | ICD-10-CM | POA: Diagnosis not present

## 2018-11-27 DIAGNOSIS — R0602 Shortness of breath: Secondary | ICD-10-CM | POA: Diagnosis not present

## 2018-11-27 DIAGNOSIS — I350 Nonrheumatic aortic (valve) stenosis: Secondary | ICD-10-CM | POA: Diagnosis not present

## 2018-11-27 DIAGNOSIS — I110 Hypertensive heart disease with heart failure: Secondary | ICD-10-CM

## 2018-11-27 DIAGNOSIS — N184 Chronic kidney disease, stage 4 (severe): Secondary | ICD-10-CM

## 2018-11-27 DIAGNOSIS — E785 Hyperlipidemia, unspecified: Secondary | ICD-10-CM | POA: Diagnosis not present

## 2018-11-27 DIAGNOSIS — I2510 Atherosclerotic heart disease of native coronary artery without angina pectoris: Secondary | ICD-10-CM

## 2018-11-27 LAB — LIPID PANEL
Chol/HDL Ratio: 2.8 ratio (ref 0.0–5.0)
Cholesterol, Total: 141 mg/dL (ref 100–199)
HDL: 50 mg/dL
LDL Calculated: 69 mg/dL (ref 0–99)
Triglycerides: 112 mg/dL (ref 0–149)
VLDL Cholesterol Cal: 22 mg/dL (ref 5–40)

## 2018-11-27 LAB — COMPREHENSIVE METABOLIC PANEL
ALT: 8 IU/L (ref 0–44)
AST: 17 IU/L (ref 0–40)
Albumin/Globulin Ratio: 1.5 (ref 1.2–2.2)
Albumin: 4.1 g/dL (ref 3.5–4.6)
Alkaline Phosphatase: 86 IU/L (ref 39–117)
BUN/Creatinine Ratio: 17 (ref 10–24)
BUN: 36 mg/dL (ref 10–36)
Bilirubin Total: 0.4 mg/dL (ref 0.0–1.2)
CO2: 28 mmol/L (ref 20–29)
Calcium: 9 mg/dL (ref 8.6–10.2)
Chloride: 99 mmol/L (ref 96–106)
Creatinine, Ser: 2.15 mg/dL — ABNORMAL HIGH (ref 0.76–1.27)
GFR calc Af Amer: 29 mL/min/{1.73_m2} — ABNORMAL LOW (ref >59)
GFR calc non Af Amer: 25 mL/min/{1.73_m2} — ABNORMAL LOW (ref >59)
Globulin, Total: 2.8 g/dL (ref 1.5–4.5)
Glucose: 105 mg/dL — ABNORMAL HIGH (ref 65–99)
Potassium: 4.2 mmol/L (ref 3.5–5.2)
Sodium: 143 mmol/L (ref 134–144)
Total Protein: 6.9 g/dL (ref 6.0–8.5)

## 2018-11-27 LAB — PRO B NATRIURETIC PEPTIDE: NT-Pro BNP: 1935 pg/mL — ABNORMAL HIGH (ref 0–486)

## 2018-11-27 NOTE — Patient Instructions (Signed)
Medication Instructions:  Your physician recommends that you continue on your current medications as directed. Please refer to the Current Medication list given to you today.  If you need a refill on your cardiac medications before your next appointment, please call your pharmacy.   Lab work: You will have lab work today:  CMP, lipids, and ProBNP If you have labs (blood work) drawn today and your tests are completely normal, you will receive your results only by: Marland Kitchen MyChart Message (if you have MyChart) OR . A paper copy in the mail If you have any lab test that is abnormal or we need to change your treatment, we will call you to review the results.  Testing/Procedures: NONE  Follow-Up: At [facility], you and your health needs are our priority.  As part of our continuing mission to provide you with exceptional heart care, we have created designated Provider Care Teams.  These Care Teams include your primary Cardiologist (physician) and Advanced Practice Providers (APPs -  Physician Assistants and Nurse Practitioners) who all work together to provide you with the care you need, when you need it. . You will need a follow up appointment in 6 months with Dr Bettina Gavia.

## 2019-02-15 DIAGNOSIS — N183 Chronic kidney disease, stage 3 (moderate): Secondary | ICD-10-CM | POA: Diagnosis not present

## 2019-02-24 DIAGNOSIS — N2581 Secondary hyperparathyroidism of renal origin: Secondary | ICD-10-CM | POA: Diagnosis not present

## 2019-02-24 DIAGNOSIS — I129 Hypertensive chronic kidney disease with stage 1 through stage 4 chronic kidney disease, or unspecified chronic kidney disease: Secondary | ICD-10-CM | POA: Diagnosis not present

## 2019-02-24 DIAGNOSIS — D638 Anemia in other chronic diseases classified elsewhere: Secondary | ICD-10-CM | POA: Diagnosis not present

## 2019-02-24 DIAGNOSIS — N184 Chronic kidney disease, stage 4 (severe): Secondary | ICD-10-CM | POA: Diagnosis not present

## 2019-03-02 DIAGNOSIS — E7800 Pure hypercholesterolemia, unspecified: Secondary | ICD-10-CM | POA: Diagnosis not present

## 2019-03-02 DIAGNOSIS — E559 Vitamin D deficiency, unspecified: Secondary | ICD-10-CM | POA: Diagnosis not present

## 2019-03-02 DIAGNOSIS — Z0000 Encounter for general adult medical examination without abnormal findings: Secondary | ICD-10-CM | POA: Diagnosis not present

## 2019-03-02 DIAGNOSIS — I10 Essential (primary) hypertension: Secondary | ICD-10-CM | POA: Diagnosis not present

## 2019-03-02 DIAGNOSIS — Z1331 Encounter for screening for depression: Secondary | ICD-10-CM | POA: Diagnosis not present

## 2019-03-02 DIAGNOSIS — M8580 Other specified disorders of bone density and structure, unspecified site: Secondary | ICD-10-CM | POA: Diagnosis not present

## 2019-03-06 DIAGNOSIS — E785 Hyperlipidemia, unspecified: Secondary | ICD-10-CM | POA: Diagnosis not present

## 2019-03-06 DIAGNOSIS — I10 Essential (primary) hypertension: Secondary | ICD-10-CM | POA: Diagnosis not present

## 2019-03-08 ENCOUNTER — Other Ambulatory Visit: Payer: Self-pay | Admitting: Cardiology

## 2019-03-08 DIAGNOSIS — I110 Hypertensive heart disease with heart failure: Secondary | ICD-10-CM

## 2019-03-08 MED ORDER — FUROSEMIDE 40 MG PO TABS: 80.0000 mg | ORAL_TABLET | Freq: Every day | ORAL | 6 refills | Status: DC

## 2019-03-08 NOTE — Telephone Encounter (Signed)
°*  STAT* If patient is at the pharmacy, call can be transferred to refill team.   1. Which medications need to be refilled? (please list name of each medication and dose if known) furosemide (LASIX) 40 MG tablet   2. Which pharmacy/location (including street and city if local pharmacy) is medication to be sent to?  [id number] [address], [place] [phone] (Phone) [phone] (Fax)    3. Do they need a 30 day or 90 day supply? 90 day

## 2019-03-08 NOTE — Telephone Encounter (Signed)
[id number]

## 2019-04-01 DIAGNOSIS — L570 Actinic keratosis: Secondary | ICD-10-CM | POA: Diagnosis not present

## 2019-04-01 DIAGNOSIS — L578 Other skin changes due to chronic exposure to nonionizing radiation: Secondary | ICD-10-CM | POA: Diagnosis not present

## 2019-04-01 DIAGNOSIS — C44629 Squamous cell carcinoma of skin of left upper limb, including shoulder: Secondary | ICD-10-CM | POA: Diagnosis not present

## 2019-04-01 DIAGNOSIS — L820 Inflamed seborrheic keratosis: Secondary | ICD-10-CM | POA: Diagnosis not present

## 2019-04-15 DIAGNOSIS — C44212 Basal cell carcinoma of skin of right ear and external auricular canal: Secondary | ICD-10-CM | POA: Diagnosis not present

## 2019-04-15 DIAGNOSIS — L570 Actinic keratosis: Secondary | ICD-10-CM | POA: Diagnosis not present

## 2019-05-12 DIAGNOSIS — S40211A Abrasion of right shoulder, initial encounter: Secondary | ICD-10-CM | POA: Diagnosis not present

## 2019-05-12 DIAGNOSIS — S46911A Strain of unspecified muscle, fascia and tendon at shoulder and upper arm level, right arm, initial encounter: Secondary | ICD-10-CM | POA: Diagnosis not present

## 2019-05-12 DIAGNOSIS — S51812A Laceration without foreign body of left forearm, initial encounter: Secondary | ICD-10-CM | POA: Diagnosis not present

## 2019-05-12 DIAGNOSIS — S46912A Strain of unspecified muscle, fascia and tendon at shoulder and upper arm level, left arm, initial encounter: Secondary | ICD-10-CM | POA: Diagnosis not present

## 2019-05-12 DIAGNOSIS — S20219A Contusion of unspecified front wall of thorax, initial encounter: Secondary | ICD-10-CM | POA: Diagnosis not present

## 2019-05-31 NOTE — Progress Notes (Signed)
Cardiology Office Note:    Date:  06/01/2019   ID:  Harry Klein, DOB December 05, 1921, MRN [id number]  PCP:  Patient, No Pcp Per  Cardiologist:  Shirlee More, MD    Referring MD: No ref. provider found    ASSESSMENT:    1. CAD in native artery   2. Ischemic cardiomyopathy   3. Hypertensive heart disease with heart failure ([place])   4. Chronic kidney disease, stage IV (severe) ([place])   5. Dyslipidemia   6. Nonrheumatic aortic valve stenosis    PLAN:    In order of problems listed above:  1. Stable CAD continue medical treatment having no angina at this time I would not do an ischemia evaluation 2. Improved recent echocardiogram showed normal ejection fraction 3. Stable continue current treatment heart failure is compensated still breathless New York Heart Association class II I do not think increasing diuretics and more intense therapy would change his symptoms 4. Stable recheck renal function 5. Continue statin check lipid profile liver function safety and efficacy 6. Recheck echocardiogram regarding progression of aortic stenosis although by physical exam I doubt it severe   Next appointment: 6 months   Medication Adjustments/Labs and Tests Ordered: Current medicines are reviewed at length with the patient today.  Concerns regarding medicines are outlined above.  No orders of the defined types were placed in this encounter.  No orders of the defined types were placed in this encounter.   No chief complaint on file.   History of Present Illness:    Harry Klein is a 85 y.o. male with a hx of CAD with CABG, heart failure with EF reported 25%, mild aortic stenosis, hypertension CKD stage III-IV and hyperlipidemia  last seen 05/08/18.  Subsequent echocardiogram performed September 2019 showed a market improvement normal left ventricular size and the ejection fraction had normalized at that time   Echo 06/17/18: Study Conclusions - Left ventricle: The cavity size was normal.  Systolic function was   normal. The estimated ejection fraction was in the range of 55%   to 60%. Wall motion was normal; there were no regional wall   motion abnormalities. Features are consistent with a pseudonormal   left ventricular filling pattern, with concomitant abnormal   relaxation and increased filling pressure (grade 2 diastolic   dysfunction). - Aortic valve: There was mild to moderate stenosis. There was   trivial regurgitation. Valve area (VTI): 1.11 cm^2. Valve area   (Vmax): 1.23 cm^2. Valve area (Vmean): 1.01 cm^2. - Mitral valve: There was mild regurgitation. - Left atrium: The atrium was moderately dilated. - Pulmonary arteries: PA peak pressure: 35 mm Hg (S). Impressions: - Mild to moderate AS.   Normal LVEF. He was  last seen 11/27/2018. Compliance with diet, lifestyle and medications: Yes  Overall doing well he is just a little bit more short of breath than he was before when he walks outdoors longer distances no edema orthopnea chest pain palpitation or syncope he has had one recent fall. Past Medical History:  Diagnosis Date  . BPH (benign prostatic hyperplasia) 04/13/2014  . Cardiomyopathy ([place]) 07/10/2015   Overview:  Ejection fraction 40% in 2015  . CHF (congestive heart failure) ([place])   . Chronic kidney disease, stage IV (severe) ([place]) 03/08/2016  . Coronary artery disease   . Dyslipidemia 07/10/2015  . Endoleak post (EVAR) endovascular aneurysm repair, subsequent encounter 11/16/2015  . Hyperlipidemia   . Hypertension   . Hypertensive heart disease with heart failure ([place]) 02/22/2016  .  LV dysfunction 07/15/2017   Overview:  Last EF 40%  . Nocturia 04/13/2014  . Nonrheumatic aortic valve stenosis 03/08/2016   Overview:  Mild  . Presence of aortocoronary bypass graft 07/10/2015   Overview:  Done in 2002  . Urge incontinence 04/13/2014    Past Surgical History:  Procedure Laterality Date  . BACK SURGERY    . CARDIAC CATHETERIZATION    . CORONARY ARTERY BYPASS  GRAFT    . REPLACEMENT TOTAL KNEE BILATERAL      Current Medications: Current Meds  Medication Sig  . clopidogrel (PLAVIX) 75 MG tablet Take 1 tablet by mouth daily.  . cyanocobalamin (,VITAMIN B-12,) 1000 MCG/ML injection Inject 1,000 mcg into the skin every 30 (thirty) days.  . finasteride (PROSCAR) 5 MG tablet Take 1 tablet by mouth every other day.  . furosemide (LASIX) 40 MG tablet Take 2 tablets (80 mg total) by mouth daily.  . hydrALAZINE (APRESOLINE) 50 MG tablet Take 50 mg by mouth 3 (three) times daily.  . isosorbide dinitrate (ISORDIL) 20 MG tablet Take 1 tablet (20 mg total) by mouth 3 (three) times daily.  . metoprolol succinate (TOPROL-XL) 25 MG 24 hr tablet Take 1 tablet by mouth daily.  . Multiple Vitamins-Minerals (PRESERVISION AREDS 2 PO) Take 2 tablets by mouth daily.   . polyethylene glycol powder (GLYCOLAX/MIRALAX) powder Take 17 g by mouth daily.  . pravastatin (PRAVACHOL) 80 MG tablet Take 1 tablet by mouth daily.  Marland Kitchen pyridOXINE (VITAMIN B-6) 100 MG tablet Take 100 mg by mouth daily.  . silodosin (RAPAFLO) 8 MG CAPS capsule Take 1 capsule by mouth daily.  . Vitamin D, Ergocalciferol, (DRISDOL) 50000 units CAPS capsule Take 50,000 Units by mouth once a week.     Allergies:   Patient has no known allergies.   Social History   Socioeconomic History  . Marital status: Married    Spouse name: Not on file  . Number of children: Not on file  . Years of education: Not on file  . Highest education level: Not on file  Occupational History  . Not on file  Social Needs  . Financial resource strain: Not on file  . Food insecurity    Worry: Not on file    Inability: Not on file  . Transportation needs    Medical: Not on file    Non-medical: Not on file  Tobacco Use  . Smoking status: Former Smoker    Types: Cigarettes  . Smokeless tobacco: Never Used  Substance and Sexual Activity  . Alcohol use: Never    Frequency: Never  . Drug use: Never  . Sexual  activity: Not on file  Lifestyle  . Physical activity    Days per week: Not on file    Minutes per session: Not on file  . Stress: Not on file  Relationships  . Social Herbalist on phone: Not on file    Gets together: Not on file    Attends religious service: Not on file    Active member of club or organization: Not on file    Attends meetings of clubs or organizations: Not on file    Relationship status: Not on file  Other Topics Concern  . Not on file  Social History Narrative  . Not on file     Family History: The patient's family history is negative for Heart attack, Heart disease, Stroke, Coronary artery disease, and Congestive Heart Failure. ROS:   Please see the history  of present illness.    All other systems reviewed and are negative.  EKGs/Labs/Other Studies Reviewed:    The following studies were reviewed today:  EKG:  EKG ordered today and personally reviewed.  The ekg ordered today demonstrates atrial rhythm 57 bpm first-degree AV block nonspecific ST  Recent Labs: 11/27/2018: ALT 8; BUN 36; Creatinine, Ser 2.15; NT-Pro BNP 1,935; Potassium 4.2; Sodium 143  Recent Lipid Panel    Component Value Date/Time   CHOL 141 11/27/2018 1041   TRIG 112 11/27/2018 1041   HDL 50 11/27/2018 1041   CHOLHDL 2.8 11/27/2018 1041   LDLCALC 69 11/27/2018 1041    Physical Exam:    VS:  BP (!) 148/62 (BP Location: Right Arm, Patient Position: Sitting, Cuff Size: Normal)   Pulse (!) 57   Ht 5\' 10"  (1.778 m)   Wt 187 lb 3.2 oz (84.9 kg)   SpO2 93%   BMI 26.86 kg/m     Wt Readings from Last 3 Encounters:  06/01/19 187 lb 3.2 oz (84.9 kg)  11/27/18 186 lb (84.4 kg)  05/08/18 189 lb (85.7 kg)     GEN:  Well nourished, well developed in no acute distress HEENT: Normal NECK: No JVD; No carotid bruits LYMPHATICS: No lymphadenopathy CARDIAC: RRR, grade 2/6 to 3/6 systolic ejection murmur does not encompass S2 limited to aortic area rubs, gallops RESPIRATORY:   Clear to auscultation without rales, wheezing or rhonchi  ABDOMEN: Soft, non-tender, non-distended MUSCULOSKELETAL:  No edema; No deformity  SKIN: Warm and dry NEUROLOGIC:  Alert and oriented x 3 PSYCHIATRIC:  Normal affect    Signed, Shirlee More, MD  06/01/2019 3:43 PM    [facility]

## 2019-06-01 ENCOUNTER — Encounter: Payer: Self-pay | Admitting: Cardiology

## 2019-06-01 ENCOUNTER — Other Ambulatory Visit: Payer: Self-pay

## 2019-06-01 ENCOUNTER — Ambulatory Visit: Payer: Medicare Other | Admitting: Cardiology

## 2019-06-01 ENCOUNTER — Ambulatory Visit (INDEPENDENT_AMBULATORY_CARE_PROVIDER_SITE_OTHER): Payer: Medicare Other | Admitting: Cardiology

## 2019-06-01 VITALS — BP 148/62 | HR 57 | Ht 70.0 in | Wt 187.2 lb

## 2019-06-01 DIAGNOSIS — N184 Chronic kidney disease, stage 4 (severe): Secondary | ICD-10-CM | POA: Diagnosis not present

## 2019-06-01 DIAGNOSIS — I350 Nonrheumatic aortic (valve) stenosis: Secondary | ICD-10-CM

## 2019-06-01 DIAGNOSIS — E785 Hyperlipidemia, unspecified: Secondary | ICD-10-CM | POA: Diagnosis not present

## 2019-06-01 DIAGNOSIS — I255 Ischemic cardiomyopathy: Secondary | ICD-10-CM | POA: Diagnosis not present

## 2019-06-01 DIAGNOSIS — I110 Hypertensive heart disease with heart failure: Secondary | ICD-10-CM

## 2019-06-01 DIAGNOSIS — I2510 Atherosclerotic heart disease of native coronary artery without angina pectoris: Secondary | ICD-10-CM

## 2019-06-01 NOTE — Patient Instructions (Signed)
Medication Instructions:  Your physician recommends that you continue on your current medications as directed. Please refer to the Current Medication list given to you today.  If you need a refill on your cardiac medications before your next appointment, please call your pharmacy.   Lab work: Your physician recommends that you return for lab work today: CMP, lipid panel, ProBNP.   If you have labs (blood work) drawn today and your tests are completely normal, you will receive your results only by: Marland Kitchen MyChart Message (if you have MyChart) OR . A paper copy in the mail If you have any lab test that is abnormal or we need to change your treatment, we will call you to review the results.  Testing/Procedures: You had an EKG today.   Follow-Up: At [facility], you and your health needs are our priority.  As part of our continuing mission to provide you with exceptional heart care, we have created designated Provider Care Teams.  These Care Teams include your primary Cardiologist (physician) and Advanced Practice Providers (APPs -  Physician Assistants and Nurse Practitioners) who all work together to provide you with the care you need, when you need it. You will need a follow up appointment in 6 months.  Please call our office 2 months in advance to schedule this appointment.

## 2019-06-02 LAB — COMPREHENSIVE METABOLIC PANEL
ALT: 5 IU/L (ref 0–44)
AST: 17 IU/L (ref 0–40)
Albumin/Globulin Ratio: 1.4 (ref 1.2–2.2)
Albumin: 3.9 g/dL (ref 3.5–4.6)
Alkaline Phosphatase: 145 IU/L — ABNORMAL HIGH (ref 39–117)
BUN/Creatinine Ratio: 19 (ref 10–24)
BUN: 37 mg/dL — ABNORMAL HIGH (ref 10–36)
Bilirubin Total: 0.3 mg/dL (ref 0.0–1.2)
CO2: 28 mmol/L (ref 20–29)
Calcium: 8.7 mg/dL (ref 8.6–10.2)
Chloride: 97 mmol/L (ref 96–106)
Creatinine, Ser: 1.94 mg/dL — ABNORMAL HIGH (ref 0.76–1.27)
GFR calc Af Amer: 33 mL/min/{1.73_m2} — ABNORMAL LOW (ref >59)
GFR calc non Af Amer: 29 mL/min/{1.73_m2} — ABNORMAL LOW (ref >59)
Globulin, Total: 2.7 g/dL (ref 1.5–4.5)
Glucose: 111 mg/dL — ABNORMAL HIGH (ref 65–99)
Potassium: 3.9 mmol/L (ref 3.5–5.2)
Sodium: 140 mmol/L (ref 134–144)
Total Protein: 6.6 g/dL (ref 6.0–8.5)

## 2019-06-02 LAB — LIPID PANEL
Chol/HDL Ratio: 2.6 ratio (ref 0.0–5.0)
Cholesterol, Total: 126 mg/dL (ref 100–199)
HDL: 48 mg/dL (ref >39)
LDL Calculated: 61 mg/dL (ref 0–99)
Triglycerides: 86 mg/dL (ref 0–149)
VLDL Cholesterol Cal: 17 mg/dL (ref 5–40)

## 2019-06-02 LAB — PRO B NATRIURETIC PEPTIDE: NT-Pro BNP: 2957 pg/mL — ABNORMAL HIGH (ref 0–486)

## 2019-06-04 DIAGNOSIS — Z6828 Body mass index (BMI) 28.0-28.9, adult: Secondary | ICD-10-CM | POA: Diagnosis not present

## 2019-06-04 DIAGNOSIS — Z9181 History of falling: Secondary | ICD-10-CM | POA: Diagnosis not present

## 2019-06-04 DIAGNOSIS — Z1331 Encounter for screening for depression: Secondary | ICD-10-CM | POA: Diagnosis not present

## 2019-06-04 DIAGNOSIS — S51802S Unspecified open wound of left forearm, sequela: Secondary | ICD-10-CM | POA: Diagnosis not present

## 2019-06-04 DIAGNOSIS — I10 Essential (primary) hypertension: Secondary | ICD-10-CM | POA: Diagnosis not present

## 2019-06-07 DIAGNOSIS — N138 Other obstructive and reflux uropathy: Secondary | ICD-10-CM | POA: Diagnosis not present

## 2019-06-07 DIAGNOSIS — N401 Enlarged prostate with lower urinary tract symptoms: Secondary | ICD-10-CM | POA: Diagnosis not present

## 2019-06-15 ENCOUNTER — Other Ambulatory Visit: Payer: Self-pay | Admitting: Cardiology

## 2019-06-15 MED ORDER — ISOSORBIDE DINITRATE 20 MG PO TABS: 20.0000 mg | ORAL_TABLET | Freq: Three times a day (TID) | ORAL | 1 refills | Status: DC

## 2019-06-15 NOTE — Telephone Encounter (Signed)
°*  STAT* If patient is at the pharmacy, call can be transferred to refill team.   1. Which medications need to be refilled? (please list name of each medication and dose if known) isosorbide dinitrate (ISORDIL) 20 MG   2. Which pharmacy/location (including street and city if local pharmacy) is medication to be sent to?  [id number] [place] [address], [place] [phone] (Phone) [phone] (Fax)    3. Do they need a 30 day or 90 day supply?

## 2019-06-15 NOTE — Telephone Encounter (Signed)
Isordil 20 mg 3 times daily refilled.

## 2019-06-21 DIAGNOSIS — M6281 Muscle weakness (generalized): Secondary | ICD-10-CM | POA: Diagnosis not present

## 2019-06-21 DIAGNOSIS — R293 Abnormal posture: Secondary | ICD-10-CM | POA: Diagnosis not present

## 2019-06-21 DIAGNOSIS — R2689 Other abnormalities of gait and mobility: Secondary | ICD-10-CM | POA: Diagnosis not present

## 2019-06-21 DIAGNOSIS — R2681 Unsteadiness on feet: Secondary | ICD-10-CM | POA: Diagnosis not present

## 2019-06-24 DIAGNOSIS — R293 Abnormal posture: Secondary | ICD-10-CM | POA: Diagnosis not present

## 2019-06-24 DIAGNOSIS — R2681 Unsteadiness on feet: Secondary | ICD-10-CM | POA: Diagnosis not present

## 2019-06-24 DIAGNOSIS — R2689 Other abnormalities of gait and mobility: Secondary | ICD-10-CM | POA: Diagnosis not present

## 2019-06-24 DIAGNOSIS — M6281 Muscle weakness (generalized): Secondary | ICD-10-CM | POA: Diagnosis not present

## 2019-06-30 ENCOUNTER — Ambulatory Visit (INDEPENDENT_AMBULATORY_CARE_PROVIDER_SITE_OTHER): Payer: Medicare Other

## 2019-06-30 ENCOUNTER — Other Ambulatory Visit: Payer: Self-pay

## 2019-06-30 DIAGNOSIS — M6281 Muscle weakness (generalized): Secondary | ICD-10-CM | POA: Diagnosis not present

## 2019-06-30 DIAGNOSIS — I255 Ischemic cardiomyopathy: Secondary | ICD-10-CM

## 2019-06-30 DIAGNOSIS — R293 Abnormal posture: Secondary | ICD-10-CM | POA: Diagnosis not present

## 2019-06-30 DIAGNOSIS — R2689 Other abnormalities of gait and mobility: Secondary | ICD-10-CM | POA: Diagnosis not present

## 2019-06-30 DIAGNOSIS — R2681 Unsteadiness on feet: Secondary | ICD-10-CM | POA: Diagnosis not present

## 2019-06-30 DIAGNOSIS — I350 Nonrheumatic aortic (valve) stenosis: Secondary | ICD-10-CM

## 2019-06-30 NOTE — Progress Notes (Signed)
Complete echocardiogram has been performed.  Jimmy Thersia Petraglia RDCS, RVT 

## 2019-07-02 DIAGNOSIS — M6281 Muscle weakness (generalized): Secondary | ICD-10-CM | POA: Diagnosis not present

## 2019-07-02 DIAGNOSIS — R2689 Other abnormalities of gait and mobility: Secondary | ICD-10-CM | POA: Diagnosis not present

## 2019-07-02 DIAGNOSIS — R2681 Unsteadiness on feet: Secondary | ICD-10-CM | POA: Diagnosis not present

## 2019-07-02 DIAGNOSIS — R293 Abnormal posture: Secondary | ICD-10-CM | POA: Diagnosis not present

## 2019-07-06 DIAGNOSIS — R2681 Unsteadiness on feet: Secondary | ICD-10-CM | POA: Diagnosis not present

## 2019-07-06 DIAGNOSIS — M6281 Muscle weakness (generalized): Secondary | ICD-10-CM | POA: Diagnosis not present

## 2019-07-06 DIAGNOSIS — R2689 Other abnormalities of gait and mobility: Secondary | ICD-10-CM | POA: Diagnosis not present

## 2019-07-06 DIAGNOSIS — R293 Abnormal posture: Secondary | ICD-10-CM | POA: Diagnosis not present

## 2019-07-09 DIAGNOSIS — R293 Abnormal posture: Secondary | ICD-10-CM | POA: Diagnosis not present

## 2019-07-09 DIAGNOSIS — M6281 Muscle weakness (generalized): Secondary | ICD-10-CM | POA: Diagnosis not present

## 2019-07-09 DIAGNOSIS — R2689 Other abnormalities of gait and mobility: Secondary | ICD-10-CM | POA: Diagnosis not present

## 2019-07-09 DIAGNOSIS — R2681 Unsteadiness on feet: Secondary | ICD-10-CM | POA: Diagnosis not present

## 2019-07-12 DIAGNOSIS — R2689 Other abnormalities of gait and mobility: Secondary | ICD-10-CM | POA: Diagnosis not present

## 2019-07-12 DIAGNOSIS — R293 Abnormal posture: Secondary | ICD-10-CM | POA: Diagnosis not present

## 2019-07-12 DIAGNOSIS — M6281 Muscle weakness (generalized): Secondary | ICD-10-CM | POA: Diagnosis not present

## 2019-07-12 DIAGNOSIS — R2681 Unsteadiness on feet: Secondary | ICD-10-CM | POA: Diagnosis not present

## 2019-07-14 DIAGNOSIS — M6281 Muscle weakness (generalized): Secondary | ICD-10-CM | POA: Diagnosis not present

## 2019-07-14 DIAGNOSIS — R293 Abnormal posture: Secondary | ICD-10-CM | POA: Diagnosis not present

## 2019-07-14 DIAGNOSIS — R2689 Other abnormalities of gait and mobility: Secondary | ICD-10-CM | POA: Diagnosis not present

## 2019-07-14 DIAGNOSIS — R2681 Unsteadiness on feet: Secondary | ICD-10-CM | POA: Diagnosis not present

## 2019-07-19 DIAGNOSIS — R2681 Unsteadiness on feet: Secondary | ICD-10-CM | POA: Diagnosis not present

## 2019-07-19 DIAGNOSIS — R2689 Other abnormalities of gait and mobility: Secondary | ICD-10-CM | POA: Diagnosis not present

## 2019-07-19 DIAGNOSIS — R293 Abnormal posture: Secondary | ICD-10-CM | POA: Diagnosis not present

## 2019-07-19 DIAGNOSIS — M6281 Muscle weakness (generalized): Secondary | ICD-10-CM | POA: Diagnosis not present

## 2019-07-20 DIAGNOSIS — L821 Other seborrheic keratosis: Secondary | ICD-10-CM | POA: Diagnosis not present

## 2019-07-20 DIAGNOSIS — C44212 Basal cell carcinoma of skin of right ear and external auricular canal: Secondary | ICD-10-CM | POA: Diagnosis not present

## 2019-07-20 DIAGNOSIS — L570 Actinic keratosis: Secondary | ICD-10-CM | POA: Diagnosis not present

## 2019-07-21 DIAGNOSIS — R293 Abnormal posture: Secondary | ICD-10-CM | POA: Diagnosis not present

## 2019-07-21 DIAGNOSIS — R2681 Unsteadiness on feet: Secondary | ICD-10-CM | POA: Diagnosis not present

## 2019-07-21 DIAGNOSIS — R2689 Other abnormalities of gait and mobility: Secondary | ICD-10-CM | POA: Diagnosis not present

## 2019-07-21 DIAGNOSIS — M6281 Muscle weakness (generalized): Secondary | ICD-10-CM | POA: Diagnosis not present

## 2019-07-26 DIAGNOSIS — R293 Abnormal posture: Secondary | ICD-10-CM | POA: Diagnosis not present

## 2019-07-26 DIAGNOSIS — M6281 Muscle weakness (generalized): Secondary | ICD-10-CM | POA: Diagnosis not present

## 2019-07-26 DIAGNOSIS — R2689 Other abnormalities of gait and mobility: Secondary | ICD-10-CM | POA: Diagnosis not present

## 2019-07-26 DIAGNOSIS — R2681 Unsteadiness on feet: Secondary | ICD-10-CM | POA: Diagnosis not present

## 2019-07-28 DIAGNOSIS — M6281 Muscle weakness (generalized): Secondary | ICD-10-CM | POA: Diagnosis not present

## 2019-07-28 DIAGNOSIS — R2689 Other abnormalities of gait and mobility: Secondary | ICD-10-CM | POA: Diagnosis not present

## 2019-07-28 DIAGNOSIS — R2681 Unsteadiness on feet: Secondary | ICD-10-CM | POA: Diagnosis not present

## 2019-07-28 DIAGNOSIS — R293 Abnormal posture: Secondary | ICD-10-CM | POA: Diagnosis not present

## 2019-08-02 DIAGNOSIS — M6281 Muscle weakness (generalized): Secondary | ICD-10-CM | POA: Diagnosis not present

## 2019-08-02 DIAGNOSIS — R2689 Other abnormalities of gait and mobility: Secondary | ICD-10-CM | POA: Diagnosis not present

## 2019-08-02 DIAGNOSIS — R2681 Unsteadiness on feet: Secondary | ICD-10-CM | POA: Diagnosis not present

## 2019-08-02 DIAGNOSIS — R293 Abnormal posture: Secondary | ICD-10-CM | POA: Diagnosis not present

## 2019-08-04 DIAGNOSIS — R293 Abnormal posture: Secondary | ICD-10-CM | POA: Diagnosis not present

## 2019-08-04 DIAGNOSIS — M6281 Muscle weakness (generalized): Secondary | ICD-10-CM | POA: Diagnosis not present

## 2019-08-04 DIAGNOSIS — R2689 Other abnormalities of gait and mobility: Secondary | ICD-10-CM | POA: Diagnosis not present

## 2019-08-04 DIAGNOSIS — R2681 Unsteadiness on feet: Secondary | ICD-10-CM | POA: Diagnosis not present

## 2019-08-06 DIAGNOSIS — E7800 Pure hypercholesterolemia, unspecified: Secondary | ICD-10-CM | POA: Diagnosis not present

## 2019-08-06 DIAGNOSIS — N189 Chronic kidney disease, unspecified: Secondary | ICD-10-CM | POA: Diagnosis not present

## 2019-08-06 DIAGNOSIS — I10 Essential (primary) hypertension: Secondary | ICD-10-CM | POA: Diagnosis not present

## 2019-08-09 DIAGNOSIS — R2689 Other abnormalities of gait and mobility: Secondary | ICD-10-CM | POA: Diagnosis not present

## 2019-08-09 DIAGNOSIS — M6281 Muscle weakness (generalized): Secondary | ICD-10-CM | POA: Diagnosis not present

## 2019-08-09 DIAGNOSIS — R2681 Unsteadiness on feet: Secondary | ICD-10-CM | POA: Diagnosis not present

## 2019-08-09 DIAGNOSIS — R293 Abnormal posture: Secondary | ICD-10-CM | POA: Diagnosis not present

## 2019-08-11 DIAGNOSIS — R2681 Unsteadiness on feet: Secondary | ICD-10-CM | POA: Diagnosis not present

## 2019-08-11 DIAGNOSIS — M6281 Muscle weakness (generalized): Secondary | ICD-10-CM | POA: Diagnosis not present

## 2019-08-11 DIAGNOSIS — R2689 Other abnormalities of gait and mobility: Secondary | ICD-10-CM | POA: Diagnosis not present

## 2019-08-11 DIAGNOSIS — R293 Abnormal posture: Secondary | ICD-10-CM | POA: Diagnosis not present

## 2019-08-17 DIAGNOSIS — R2689 Other abnormalities of gait and mobility: Secondary | ICD-10-CM | POA: Diagnosis not present

## 2019-08-17 DIAGNOSIS — R293 Abnormal posture: Secondary | ICD-10-CM | POA: Diagnosis not present

## 2019-08-17 DIAGNOSIS — R2681 Unsteadiness on feet: Secondary | ICD-10-CM | POA: Diagnosis not present

## 2019-08-17 DIAGNOSIS — M6281 Muscle weakness (generalized): Secondary | ICD-10-CM | POA: Diagnosis not present

## 2019-08-19 DIAGNOSIS — R293 Abnormal posture: Secondary | ICD-10-CM | POA: Diagnosis not present

## 2019-08-19 DIAGNOSIS — M6281 Muscle weakness (generalized): Secondary | ICD-10-CM | POA: Diagnosis not present

## 2019-08-19 DIAGNOSIS — R2689 Other abnormalities of gait and mobility: Secondary | ICD-10-CM | POA: Diagnosis not present

## 2019-08-19 DIAGNOSIS — R2681 Unsteadiness on feet: Secondary | ICD-10-CM | POA: Diagnosis not present

## 2019-08-25 DIAGNOSIS — M6281 Muscle weakness (generalized): Secondary | ICD-10-CM | POA: Diagnosis not present

## 2019-08-25 DIAGNOSIS — R293 Abnormal posture: Secondary | ICD-10-CM | POA: Diagnosis not present

## 2019-08-25 DIAGNOSIS — R2689 Other abnormalities of gait and mobility: Secondary | ICD-10-CM | POA: Diagnosis not present

## 2019-08-25 DIAGNOSIS — R2681 Unsteadiness on feet: Secondary | ICD-10-CM | POA: Diagnosis not present

## 2019-08-27 DIAGNOSIS — R2681 Unsteadiness on feet: Secondary | ICD-10-CM | POA: Diagnosis not present

## 2019-08-27 DIAGNOSIS — R2689 Other abnormalities of gait and mobility: Secondary | ICD-10-CM | POA: Diagnosis not present

## 2019-08-27 DIAGNOSIS — M6281 Muscle weakness (generalized): Secondary | ICD-10-CM | POA: Diagnosis not present

## 2019-08-27 DIAGNOSIS — R293 Abnormal posture: Secondary | ICD-10-CM | POA: Diagnosis not present

## 2019-08-31 ENCOUNTER — Other Ambulatory Visit: Payer: Self-pay | Admitting: Cardiology

## 2019-08-31 MED ORDER — PRAVASTATIN SODIUM 80 MG PO TABS: 80.0000 mg | ORAL_TABLET | Freq: Every day | ORAL | 0 refills | Status: DC

## 2019-08-31 NOTE — Telephone Encounter (Signed)
°*  STAT* If patient is at the pharmacy, call can be transferred to refill team.   1. Which medications need to be refilled? (please list name of each medication and dose if known) pravastatin (PRAVACHOL) 80 MG   2. Which pharmacy/location (including street and city if local pharmacy) is medication to be sent to?  [id number] [address], [place] [phone] (Phone) [phone] (Fax)    3. Do they need a 30 day or 90 day supply? [place]

## 2019-08-31 NOTE — Telephone Encounter (Signed)
[facility] [place] as requested.

## 2019-09-06 DIAGNOSIS — R2689 Other abnormalities of gait and mobility: Secondary | ICD-10-CM | POA: Diagnosis not present

## 2019-09-06 DIAGNOSIS — R293 Abnormal posture: Secondary | ICD-10-CM | POA: Diagnosis not present

## 2019-09-06 DIAGNOSIS — M6281 Muscle weakness (generalized): Secondary | ICD-10-CM | POA: Diagnosis not present

## 2019-09-06 DIAGNOSIS — R2681 Unsteadiness on feet: Secondary | ICD-10-CM | POA: Diagnosis not present

## 2019-09-09 ENCOUNTER — Telehealth: Payer: Self-pay | Admitting: Cardiology

## 2019-09-09 DIAGNOSIS — I110 Hypertensive heart disease with heart failure: Secondary | ICD-10-CM

## 2019-09-10 MED ORDER — FUROSEMIDE 40 MG PO TABS: 80.0000 mg | ORAL_TABLET | Freq: Every day | ORAL | 0 refills | Status: DC

## 2019-09-10 NOTE — Addendum Note (Signed)
Addended by: Austin Miles on: 09/10/2019 01:43 PM   Modules accepted: Orders

## 2019-09-10 NOTE — Telephone Encounter (Signed)
°*  STAT* If patient is at the pharmacy, call can be transferred to refill team.   1. Which medications need to be refilled? (please list name of each medication and dose if known) furosemide  2. Which pharmacy/location (including street and city if local pharmacy) [facility]  3. Do they need a 30 day or 90 day supply? [place]

## 2019-09-10 NOTE — Telephone Encounter (Signed)
Refill sent as requested. 

## 2019-10-19 DIAGNOSIS — N184 Chronic kidney disease, stage 4 (severe): Secondary | ICD-10-CM | POA: Diagnosis not present

## 2019-10-21 DIAGNOSIS — L578 Other skin changes due to chronic exposure to nonionizing radiation: Secondary | ICD-10-CM | POA: Diagnosis not present

## 2019-10-21 DIAGNOSIS — L570 Actinic keratosis: Secondary | ICD-10-CM | POA: Diagnosis not present

## 2019-10-21 DIAGNOSIS — L821 Other seborrheic keratosis: Secondary | ICD-10-CM | POA: Diagnosis not present

## 2019-10-25 DIAGNOSIS — D638 Anemia in other chronic diseases classified elsewhere: Secondary | ICD-10-CM | POA: Diagnosis not present

## 2019-10-25 DIAGNOSIS — N2581 Secondary hyperparathyroidism of renal origin: Secondary | ICD-10-CM | POA: Diagnosis not present

## 2019-10-25 DIAGNOSIS — N184 Chronic kidney disease, stage 4 (severe): Secondary | ICD-10-CM | POA: Diagnosis not present

## 2019-10-25 DIAGNOSIS — I129 Hypertensive chronic kidney disease with stage 1 through stage 4 chronic kidney disease, or unspecified chronic kidney disease: Secondary | ICD-10-CM | POA: Diagnosis not present

## 2019-10-27 ENCOUNTER — Other Ambulatory Visit: Payer: Self-pay

## 2019-10-27 ENCOUNTER — Other Ambulatory Visit: Payer: Self-pay | Admitting: Cardiology

## 2019-10-27 MED ORDER — CLOPIDOGREL BISULFATE 75 MG PO TABS: 75.0000 mg | ORAL_TABLET | Freq: Every day | ORAL | 3 refills | Status: AC

## 2019-10-27 MED ORDER — CLOPIDOGREL BISULFATE 75 MG PO TABS: 75.0000 mg | ORAL_TABLET | Freq: Every day | ORAL | 3 refills | Status: DC

## 2019-10-27 NOTE — Telephone Encounter (Signed)
New Message    *STAT* If patient is at the pharmacy, call can be transferred to refill team.   1. Which medications need to be refilled? (please list name of each medication and dose if known) clopidogrel (PLAVIX) 75 MG tablet    2. Which pharmacy/location (including street and city if local pharmacy) is medication to be sent to? [id number] [place], [place]  3. Do they need a 30 day or 90 day supply? [place]

## 2019-10-27 NOTE — Progress Notes (Signed)
Rx sent to pharm

## 2019-11-01 ENCOUNTER — Other Ambulatory Visit: Payer: Self-pay | Admitting: *Deleted

## 2019-11-01 ENCOUNTER — Telehealth: Payer: Self-pay | Admitting: Cardiology

## 2019-11-01 MED ORDER — HYDRALAZINE HCL 50 MG PO TABS: 50.0000 mg | ORAL_TABLET | Freq: Three times a day (TID) | ORAL | 1 refills | Status: DC

## 2019-11-01 NOTE — Telephone Encounter (Signed)
*  STAT* If patient is at the pharmacy, call can be transferred to refill team.   1. Which medications need to be refilled? (please list name of each medication and dose if known) hydrALAZINE (APRESOLINE) 50 MG tablet  2. Which pharmacy/location (including street and city if local pharmacy) is medication to be sent to? [id number] [place], [place]  3. Do they need a 30 day or 90 day supply? 90 day

## 2019-11-01 NOTE — Telephone Encounter (Signed)
[facility] [phone]

## 2019-12-01 ENCOUNTER — Telehealth: Payer: Self-pay

## 2019-12-01 NOTE — Telephone Encounter (Signed)
PRAVASTATIN SODIUM 80 MG TAB. Take one tablet by mouth every day.  # 90  LAST FILLED: 09/09/2019

## 2019-12-02 MED ORDER — PRAVASTATIN SODIUM 80 MG PO TABS: 80.0000 mg | ORAL_TABLET | Freq: Every day | ORAL | 0 refills | Status: DC

## 2019-12-03 ENCOUNTER — Other Ambulatory Visit: Payer: Self-pay

## 2019-12-03 ENCOUNTER — Telehealth: Payer: Self-pay | Admitting: Cardiology

## 2019-12-03 ENCOUNTER — Encounter: Payer: Self-pay | Admitting: Cardiology

## 2019-12-03 ENCOUNTER — Ambulatory Visit (INDEPENDENT_AMBULATORY_CARE_PROVIDER_SITE_OTHER): Payer: Medicare Other | Admitting: Cardiology

## 2019-12-03 VITALS — BP 146/72 | HR 53 | Temp 96.9°F | Ht 70.0 in | Wt 191.0 lb

## 2019-12-03 DIAGNOSIS — E785 Hyperlipidemia, unspecified: Secondary | ICD-10-CM

## 2019-12-03 DIAGNOSIS — N184 Chronic kidney disease, stage 4 (severe): Secondary | ICD-10-CM | POA: Diagnosis not present

## 2019-12-03 DIAGNOSIS — I2510 Atherosclerotic heart disease of native coronary artery without angina pectoris: Secondary | ICD-10-CM

## 2019-12-03 DIAGNOSIS — I350 Nonrheumatic aortic (valve) stenosis: Secondary | ICD-10-CM

## 2019-12-03 DIAGNOSIS — I110 Hypertensive heart disease with heart failure: Secondary | ICD-10-CM | POA: Diagnosis not present

## 2019-12-03 MED ORDER — FUROSEMIDE 40 MG PO TABS: ORAL_TABLET | ORAL | 3 refills | Status: DC

## 2019-12-03 MED ORDER — FUROSEMIDE 20 MG PO TABS: ORAL_TABLET | ORAL | 3 refills | Status: DC

## 2019-12-03 NOTE — Patient Instructions (Addendum)
Medication Instructions:  Your physician has recommended you make the following change in your medication:  1.  TAKE 1 extra tablet of Lasix only on Mondays and Fridays.  Continue on the 1 tablet twice daily all other days   *If you need a refill on your cardiac medications before your next appointment, please call your pharmacy*   Lab Work: None ordered  If you have labs (blood work) drawn today and your tests are completely normal, you will receive your results only by: Marland Kitchen MyChart Message (if you have MyChart) OR . A paper copy in the mail If you have any lab test that is abnormal or we need to change your treatment, we will call you to review the results.   Testing/Procedures: None ordered   Follow-Up: At [facility], you and your health needs are our priority.  As part of our continuing mission to provide you with exceptional heart care, we have created designated Provider Care Teams.  These Care Teams include your primary Cardiologist (physician) and Advanced Practice Providers (APPs -  Physician Assistants and Nurse Practitioners) who all work together to provide you with the care you need, when you need it.  We recommend signing up for the patient portal called "MyChart".  Sign up information is provided on this After Visit Summary.  MyChart is used to connect with patients for Virtual Visits (Telemedicine).  Patients are able to view lab/test results, encounter notes, upcoming appointments, etc.  Non-urgent messages can be sent to your provider as well.   To learn more about what you can do with MyChart, go to [email or web address].    Your next appointment:   6 month(s)  The format for your next appointment:   In Person  Provider:   Shirlee More, MD   Other Instructions

## 2019-12-03 NOTE — Telephone Encounter (Signed)
Pts daughter, Langley Gauss calling in to clarify the patients Lasix dose that was changed at his appointment this afternoon. After clarifying with Dr. Bettina Gavia and his assist, I informed Sharla Kidney that he should be taking  40  mg of Lasix BID and and extra 40 mg on Monday and Friday.   She verbalized understanding.

## 2019-12-03 NOTE — Progress Notes (Signed)
Cardiology Office Note:    Date:  12/03/2019   ID:  Harry Klein, DOB 04/14/1922, MRN [id number]  PCP:  Angelina Sheriff, MD  Cardiologist:  Shirlee More, MD    Referring MD: No ref. provider found    ASSESSMENT:    1. Hypertensive heart disease with heart failure ([place])   2. CAD in native artery   3. Nonrheumatic aortic valve stenosis   4. Dyslipidemia   5. Chronic kidney disease, stage IV (severe) ([facility])    PLAN:    In order of problems listed above:  1. He has mildly decompensated heart failure fluid overload despite the severity CKD he will take an extra dose of diuretic 2 days a week and I will ask him in 2 weeks to come back and repeat BMP proBNP level.  Pressures at target for age 86 continue treatment including his diuretic hydralazine and minimum dose of beta-blocker. 2. Stable CAD New York Heart Association class I continue medical therapy including beta-blocker clopidogrel and low intensity statin 3. Stable 4. Continue statin for 2 weeks we will check lipid profile 5. Recheck labs in 2 weeks with higher dose diuretic   Next appointment: 4 months   Medication Adjustments/Labs and Tests Ordered: Current medicines are reviewed at length with the patient today.  Concerns regarding medicines are outlined above.  No orders of the defined types were placed in this encounter.  Meds ordered this encounter  Medications  . DISCONTD: furosemide (LASIX) 20 MG tablet    Sig: Take 1 tablet by mouth daily, take 1 extra tablet by mouth daily only on Mondays and Fridays    Dispense:  120 tablet    Refill:  3  . furosemide (LASIX) 40 MG tablet    Sig: Take 1 tablet by mouth twice a day and take 1 extra tablet only on Mondays and Fridays    Dispense:  80 tablet    Refill:  3    Chief Complaint  Patient presents with  . Follow-up  . Coronary Artery Disease    History of Present Illness:     Harry Klein is a 86 y.o. male with a hx of CAD with CABG, heart failure  with EF reported 25%, mild aortic stenosis, hypertension CKD stage III-IV and hyperlipidemia  last seen 06/01/2019.His echocardiogram performed September 2019 showed a market improvement normal left ventricular size and the ejection fraction had normalized at that time   Compliance with diet, lifestyle and medications: Yes  He is seen along with his daughter overall is doing well he is just a little bit more short of breath with physical activity.  He has had no angina orthopnea chest pain palpitation or syncope he was unaware that he had 2+ edema.  His medications are supervised by his daughter and she participated in the evaluation at his request providing history and decision making Past Medical History:  Diagnosis Date  . BPH (benign prostatic hyperplasia) 04/13/2014  . Cardiomyopathy ([place]) 07/10/2015   Overview:  Ejection fraction 40% in 2015  . CHF (congestive heart failure) ([place])   . Chronic kidney disease, stage IV (severe) ([place]) 03/08/2016  . Coronary artery disease   . Dyslipidemia 07/10/2015  . Endoleak post (EVAR) endovascular aneurysm repair, subsequent encounter 11/16/2015  . Hyperlipidemia   . Hypertension   . Hypertensive heart disease with heart failure ([place]) 02/22/2016  . LV dysfunction 07/15/2017   Overview:  Last EF 40%  . Nocturia 04/13/2014  . Nonrheumatic aortic valve  stenosis 03/08/2016   Overview:  Mild  . Presence of aortocoronary bypass graft 07/10/2015   Overview:  Done in 2002  . Urge incontinence 04/13/2014    Past Surgical History:  Procedure Laterality Date  . BACK SURGERY    . CARDIAC CATHETERIZATION    . CORONARY ARTERY BYPASS GRAFT    . REPLACEMENT TOTAL KNEE BILATERAL      Current Medications: Current Meds  Medication Sig  . amoxicillin (AMOXIL) 500 MG capsule TAKE 4 CAPSULES PRIOR TO DENTAL APPT  . clopidogrel (PLAVIX) 75 MG tablet Take 1 tablet (75 mg total) by mouth daily.  . cyanocobalamin (,VITAMIN B-12,) 1000 MCG/ML injection Inject 1,000 mcg into the  skin every 30 (thirty) days.  . finasteride (PROSCAR) 5 MG tablet Take 1 tablet by mouth every other day.  . hydrALAZINE (APRESOLINE) 50 MG tablet Take 1 tablet (50 mg total) by mouth 3 (three) times daily.  . isosorbide dinitrate (ISORDIL) 20 MG tablet Take 1 tablet (20 mg total) by mouth 3 (three) times daily.  . metoprolol succinate (TOPROL-XL) 25 MG 24 hr tablet Take 1 tablet by mouth daily.  . Multiple Vitamins-Minerals (PRESERVISION AREDS 2 PO) Take 2 tablets by mouth daily.   . polyethylene glycol powder (GLYCOLAX/MIRALAX) powder Take 17 g by mouth daily.  . pravastatin (PRAVACHOL) 80 MG tablet Take 1 tablet (80 mg total) by mouth daily.  Marland Kitchen pyridOXINE (VITAMIN B-6) 100 MG tablet Take 100 mg by mouth daily.  . silodosin (RAPAFLO) 8 MG CAPS capsule Take 1 capsule by mouth daily.  . Vitamin D, Ergocalciferol, (DRISDOL) 50000 units CAPS capsule Take 50,000 Units by mouth once a week.  . [DISCONTINUED] furosemide (LASIX) 40 MG tablet Take 2 tablets (80 mg total) by mouth daily.     Allergies:   Patient has no known allergies.   Social History   Socioeconomic History  . Marital status: Married    Spouse name: Not on file  . Number of children: Not on file  . Years of education: Not on file  . Highest education level: Not on file  Occupational History  . Not on file  Tobacco Use  . Smoking status: Former Smoker    Types: Cigarettes  . Smokeless tobacco: Never Used  Substance and Sexual Activity  . Alcohol use: Never  . Drug use: Never  . Sexual activity: Not Currently  Other Topics Concern  . Not on file  Social History Narrative  . Not on file   Social Determinants of Health   Financial Resource Strain:   . Difficulty of Paying Living Expenses: Not on file  Food Insecurity:   . Worried About Charity fundraiser in the Last Year: Not on file  . Ran Out of Food in the Last Year: Not on file  Transportation Needs:   . Lack of Transportation (Medical): Not on file  . Lack  of Transportation (Non-Medical): Not on file  Physical Activity:   . Days of Exercise per Week: Not on file  . Minutes of Exercise per Session: Not on file  Stress:   . Feeling of Stress : Not on file  Social Connections:   . Frequency of Communication with Friends and Family: Not on file  . Frequency of Social Gatherings with Friends and Family: Not on file  . Attends Religious Services: Not on file  . Active Member of Clubs or Organizations: Not on file  . Attends Archivist Meetings: Not on file  . Marital Status:  Not on file     Family History: The patient's family history includes Heart Problems in his father; Heart failure in his mother. There is no history of Heart attack, Heart disease, Stroke, Coronary artery disease, or Congestive Heart Failure. ROS:   Please see the history of present illness.    All other systems reviewed and are negative.  EKGs/Labs/Other Studies Reviewed:    The following studies were reviewed today:    Recent Labs:  06/01/2019: ALT 5; BUN 37; Creatinine, Ser 1.94; NT-Pro BNP 2,957; Potassium 3.9; Sodium 140  Recent Lipid Panel    Component Value Date/Time   CHOL 126 06/01/2019 1551   TRIG 86 06/01/2019 1551   HDL 48 06/01/2019 1551   CHOLHDL 2.6 06/01/2019 1551   LDLCALC 61 06/01/2019 1551    Physical Exam:    VS:  BP (!) 146/72   Pulse (!) 53   Temp (!) 96.9 F (36.1 C)   Ht 5\' 10"  (1.778 m)   Wt 191 lb (86.6 kg)   SpO2 95%   BMI 27.41 kg/m     Wt Readings from Last 3 Encounters:  12/03/19 191 lb (86.6 kg)  06/01/19 187 lb 3.2 oz (84.9 kg)  11/27/18 186 lb (84.4 kg)     GEN:  Well nourished, well developed in no acute distress HEENT: Normal NECK: No JVD; No carotid bruits LYMPHATICS: No lymphadenopathy CARDIAC: RRR, no murmurs, rubs, gallops RESPIRATORY:  Clear to auscultation without rales, wheezing or rhonchi  ABDOMEN: Soft, non-tender, non-distended MUSCULOSKELETAL: 2+ bilateral pitting ankle today edema  edema; No deformity  SKIN: Warm and dry NEUROLOGIC:  Alert and oriented x 3 PSYCHIATRIC:  Normal affect    Signed, Shirlee More, MD  12/03/2019 5:27 PM    [facility]

## 2019-12-03 NOTE — Telephone Encounter (Signed)
New messaged:     Patient daughter calling stating he taken Furosemide 40 mg twice a day but the says 20 mg. Please call patient daughter.

## 2019-12-06 DIAGNOSIS — H353133 Nonexudative age-related macular degeneration, bilateral, advanced atrophic without subfoveal involvement: Secondary | ICD-10-CM | POA: Diagnosis not present

## 2019-12-08 ENCOUNTER — Telehealth: Payer: Self-pay

## 2019-12-08 ENCOUNTER — Other Ambulatory Visit: Payer: Self-pay | Admitting: Cardiology

## 2019-12-08 DIAGNOSIS — I2510 Atherosclerotic heart disease of native coronary artery without angina pectoris: Secondary | ICD-10-CM

## 2019-12-08 DIAGNOSIS — I110 Hypertensive heart disease with heart failure: Secondary | ICD-10-CM

## 2019-12-08 DIAGNOSIS — T82330D Leakage of aortic (bifurcation) graft (replacement), subsequent encounter: Secondary | ICD-10-CM

## 2019-12-08 DIAGNOSIS — I255 Ischemic cardiomyopathy: Secondary | ICD-10-CM

## 2019-12-08 DIAGNOSIS — IMO0001 Reserved for inherently not codable concepts without codable children: Secondary | ICD-10-CM

## 2019-12-08 DIAGNOSIS — E785 Hyperlipidemia, unspecified: Secondary | ICD-10-CM

## 2019-12-08 NOTE — Telephone Encounter (Signed)
-----   Message from Richardo Priest, MD sent at 12/03/2019  5:29 PM EST ----- Please help me and I like 2 weeks to return the office for CMP lipid profile proBNP.  I was just unsure who to send this to.  Good luck in your new position and enjoyed working with you I think you are going to be happy and doing a phenomenal job.

## 2019-12-17 DIAGNOSIS — I110 Hypertensive heart disease with heart failure: Secondary | ICD-10-CM | POA: Diagnosis not present

## 2019-12-17 DIAGNOSIS — E785 Hyperlipidemia, unspecified: Secondary | ICD-10-CM | POA: Diagnosis not present

## 2019-12-17 DIAGNOSIS — I2510 Atherosclerotic heart disease of native coronary artery without angina pectoris: Secondary | ICD-10-CM | POA: Diagnosis not present

## 2019-12-17 DIAGNOSIS — I255 Ischemic cardiomyopathy: Secondary | ICD-10-CM | POA: Diagnosis not present

## 2019-12-18 LAB — COMPREHENSIVE METABOLIC PANEL
ALT: 6 IU/L (ref 0–44)
AST: 17 IU/L (ref 0–40)
Albumin/Globulin Ratio: 1.6 (ref 1.2–2.2)
Albumin: 4 g/dL (ref 3.5–4.6)
Alkaline Phosphatase: 88 IU/L (ref 39–117)
BUN/Creatinine Ratio: 19 (ref 10–24)
BUN: 37 mg/dL — ABNORMAL HIGH (ref 10–36)
Bilirubin Total: 0.5 mg/dL (ref 0.0–1.2)
CO2: 26 mmol/L (ref 20–29)
Calcium: 9 mg/dL (ref 8.6–10.2)
Chloride: 96 mmol/L (ref 96–106)
Creatinine, Ser: 2 mg/dL — ABNORMAL HIGH (ref 0.76–1.27)
GFR calc Af Amer: 32 mL/min/{1.73_m2} — ABNORMAL LOW (ref >59)
GFR calc non Af Amer: 28 mL/min/{1.73_m2} — ABNORMAL LOW (ref >59)
Globulin, Total: 2.5 g/dL (ref 1.5–4.5)
Glucose: 99 mg/dL (ref 65–99)
Potassium: 4.3 mmol/L (ref 3.5–5.2)
Sodium: 137 mmol/L (ref 134–144)
Total Protein: 6.5 g/dL (ref 6.0–8.5)

## 2019-12-18 LAB — LIPID PANEL
Chol/HDL Ratio: 2.5 ratio (ref 0.0–5.0)
Cholesterol, Total: 114 mg/dL (ref 100–199)
HDL: 46 mg/dL (ref >39)
LDL Chol Calc (NIH): 52 mg/dL (ref 0–99)
Triglycerides: 82 mg/dL (ref 0–149)
VLDL Cholesterol Cal: 16 mg/dL (ref 5–40)

## 2019-12-18 LAB — PRO B NATRIURETIC PEPTIDE: NT-Pro BNP: 4356 pg/mL — ABNORMAL HIGH (ref 0–486)

## 2020-02-17 DIAGNOSIS — L578 Other skin changes due to chronic exposure to nonionizing radiation: Secondary | ICD-10-CM | POA: Diagnosis not present

## 2020-02-17 DIAGNOSIS — C44212 Basal cell carcinoma of skin of right ear and external auricular canal: Secondary | ICD-10-CM | POA: Diagnosis not present

## 2020-02-17 DIAGNOSIS — L821 Other seborrheic keratosis: Secondary | ICD-10-CM | POA: Diagnosis not present

## 2020-02-17 DIAGNOSIS — L570 Actinic keratosis: Secondary | ICD-10-CM | POA: Diagnosis not present

## 2020-02-29 ENCOUNTER — Other Ambulatory Visit: Payer: Self-pay | Admitting: Cardiology

## 2020-03-01 ENCOUNTER — Other Ambulatory Visit: Payer: Self-pay | Admitting: Cardiology

## 2020-03-06 DIAGNOSIS — E785 Hyperlipidemia, unspecified: Secondary | ICD-10-CM | POA: Diagnosis not present

## 2020-03-06 DIAGNOSIS — I10 Essential (primary) hypertension: Secondary | ICD-10-CM | POA: Diagnosis not present

## 2020-04-03 ENCOUNTER — Telehealth: Payer: Self-pay | Admitting: Cardiology

## 2020-04-03 DIAGNOSIS — R609 Edema, unspecified: Secondary | ICD-10-CM

## 2020-04-03 NOTE — Addendum Note (Signed)
Addended by: Resa Miner I on: 04/03/2020 11:24 AM   Modules accepted: Orders

## 2020-04-03 NOTE — Telephone Encounter (Signed)
Left message on patients voicemail to please return our call.   I have placed the order for the patient to have this venous duplex and will send this over to scheduling to have it scheduled in the next two days.

## 2020-04-03 NOTE — Telephone Encounter (Signed)
Spoke to patients daughter just now and she let me know that the patient has gained about 2-3 lb's in the last week. He has been feeling worn out and weak but other than that he does not have any other symptoms at this time.   His left leg continues to be swollen but the right leg is fine. She states that theres no redness/heat in this leg and that it is just pitting edema.   He is taking his Furosemide 40 mg BID and an extra tablet on Monday and Friday.   I will route to Dr. Munley/Dr. Agustin Cree (DOD) for further recommendations.

## 2020-04-03 NOTE — Telephone Encounter (Signed)
Get duplex of unilateral swollen leg do at [facility] if we cannot accommodate in the next 2 days take furosemide 80am 40 pm force in Thurs or Fri

## 2020-04-03 NOTE — Telephone Encounter (Signed)
Spoke with the patients daughter just now regarding these recommendations. She verbalizes understanding and thanks me for the call back.   I let her know that we would be calling her back with the appointment time for this VAS Duplex.

## 2020-04-03 NOTE — Telephone Encounter (Signed)
Pt c/o swelling: STAT is pt has developed SOB within 24 hours  1) How much weight have you gained and in what time span? 3lbs in the last week  2) If swelling, where is the swelling located? Mostly in right leg. Swelling in left leg has gone down since taking the extra lasix.   3) Are you currently taking a fluid pill? Takes an extra lasix 2 days a week.  4) Are you currently SOB? Not currently, DOE. Has a lack of energy, and per daughter, patient is feeling "washed out"   5) Do you have a log of your daily weights (if so, list)? Daughter states patient does have a log of his weights but daughter is not with patient at this time.   6) Have you gained 3 pounds in a day or 5 pounds in a week? Yes.  7) Have you traveled recently? No

## 2020-04-04 ENCOUNTER — Other Ambulatory Visit: Payer: Self-pay

## 2020-04-04 ENCOUNTER — Ambulatory Visit (INDEPENDENT_AMBULATORY_CARE_PROVIDER_SITE_OTHER): Payer: Medicare Other

## 2020-04-04 DIAGNOSIS — R600 Localized edema: Secondary | ICD-10-CM

## 2020-04-04 DIAGNOSIS — R609 Edema, unspecified: Secondary | ICD-10-CM

## 2020-04-04 NOTE — Progress Notes (Addendum)
unilateral left lower extremity venous duplex exam performed.  Jimmy Lillieann Pavlich RDCS, RVT

## 2020-04-05 ENCOUNTER — Telehealth: Payer: Self-pay

## 2020-04-05 NOTE — Progress Notes (Signed)
Cardiology Office Note:    Date:  04/06/2020   ID:  [name], DOB [birth year], MRN [id number]  PCP:  Angelina Sheriff, MD  Cardiologist:  Shirlee More, MD    Referring MD: Angelina Sheriff, MD    ASSESSMENT:    1. Hypertensive heart disease with heart failure ([place])   2. Nonrheumatic aortic valve stenosis   3. Ischemic cardiomyopathy   4. Shortness of breath    PLAN:    In order of problems listed above:  1. His heart failure is clearly worsened plan as detailed below if unimproved will likely need admission IV diuretic 2. Recheck echocardiogram if his left ear is clearly severe will need to consider TAVR but would be very high risk with age comorbidities CKD and frailty   Next appointment: 1 week   Medication Adjustments/Labs and Tests Ordered: Current medicines are reviewed at length with the patient today.  Concerns regarding medicines are outlined above.  Orders Placed This Encounter  Procedures  . Basic metabolic panel  . Pro b natriuretic peptide (BNP)   Meds ordered this encounter  Medications  . torsemide (DEMADEX) 20 MG tablet    Sig: Take 1 tablet (20 mg total) by mouth 2 (two) times daily.    Dispense:  180 tablet    Refill:  3  . metolazone (ZAROXOLYN) 2.5 MG tablet    Sig: Take 1 tablet (2.5 mg total) by mouth daily.    Dispense:  2 tablet    Refill:  0    Chief Complaint  Patient presents with  . Follow-up  . Congestive Heart Failure    History of Present Illness:    Harry Klein is a 86 y.o. male with a hx of  last seen CAD with CABG, heart failure with EF reported 25% with subsequent normalization, mild aortic stenosis, hypertension CKD stage III-IV and hyperlipidemia ;ast seen 12/03/2019.  Compliance with diet, lifestyle and medications: The last few weeks his weight is up he is more short of breath and decreased endurance.  Fortunately is not short of breath at rest and has had no orthopnea or PND.  When seen today he is in  worsened heart failure we will switch from furosemide to torsemide to try to induce a diuresis and will give him 2 days of metolazone if unimproved he will likely need admission to the hospital for intravenous diuretic.  He is having no chest pain palpitation or syncope he is breathless with any activity.  His daughter is present supervises him does not add salt to his diet and is compliant with medications. Past Medical History:  Diagnosis Date  . BPH (benign prostatic hyperplasia) 04/13/2014  . Cardiomyopathy ([place]) 07/10/2015   Overview:  Ejection fraction 40% in 2015  . CHF (congestive heart failure) ([place])   . Chronic kidney disease, stage IV (severe) ([place]) 03/08/2016  . Coronary artery disease   . Dyslipidemia 07/10/2015  . Endoleak post (EVAR) endovascular aneurysm repair, subsequent encounter 11/16/2015  . Hyperlipidemia   . Hypertension   . Hypertensive heart disease with heart failure ([place]) 02/22/2016  . LV dysfunction 07/15/2017   Overview:  Last EF 40%  . Nocturia 04/13/2014  . Nonrheumatic aortic valve stenosis 03/08/2016   Overview:  Mild  . Presence of aortocoronary bypass graft 07/10/2015   Overview:  Done in 2002  . Urge incontinence 04/13/2014    Past Surgical History:  Procedure Laterality Date  . BACK SURGERY    . CARDIAC  CATHETERIZATION    . CORONARY ARTERY BYPASS GRAFT    . REPLACEMENT TOTAL KNEE BILATERAL      Current Medications: Current Meds  Medication Sig  . amoxicillin (AMOXIL) 500 MG capsule TAKE 4 CAPSULES PRIOR TO DENTAL APPT  . clopidogrel (PLAVIX) 75 MG tablet Take 1 tablet (75 mg total) by mouth daily.  . cyanocobalamin (,VITAMIN B-12,) 1000 MCG/ML injection Inject 1,000 mcg into the skin every 30 (thirty) days.  . finasteride (PROSCAR) 5 MG tablet Take 1 tablet by mouth every other day.  . hydrALAZINE (APRESOLINE) 50 MG tablet TAKE 1 TABLET BY MOUTH THREE TIMES A DAY  . isosorbide dinitrate (ISORDIL) 20 MG tablet TAKE 1 TABLET BY MOUTH THREE TIMES A DAY  .  metoprolol succinate (TOPROL-XL) 25 MG 24 hr tablet Take 1 tablet by mouth daily.  . Multiple Vitamins-Minerals (PRESERVISION AREDS 2 PO) Take 2 tablets by mouth daily.   . polyethylene glycol powder (GLYCOLAX/MIRALAX) powder Take 17 g by mouth daily.  . pravastatin (PRAVACHOL) 80 MG tablet TAKE 1 TABLET BY MOUTH EVERY DAY  . pyridOXINE (VITAMIN B-6) 100 MG tablet Take 100 mg by mouth daily.  . silodosin (RAPAFLO) 8 MG CAPS capsule Take 1 capsule by mouth daily.  . Vitamin D, Ergocalciferol, (DRISDOL) 50000 units CAPS capsule Take 50,000 Units by mouth once a week.  . [DISCONTINUED] furosemide (LASIX) 40 MG tablet Take 1 tablet by mouth twice a day and take 1 extra tablet only on Mondays and Fridays     Allergies:   Patient has no known allergies.   Social History   Socioeconomic History  . Marital status: Married    Spouse name: Not on file  . Number of children: Not on file  . Years of education: Not on file  . Highest education level: Not on file  Occupational History  . Not on file  Tobacco Use  . Smoking status: Former Smoker    Types: Cigarettes  . Smokeless tobacco: Never Used  Vaping Use  . Vaping Use: Never used  Substance and Sexual Activity  . Alcohol use: Never  . Drug use: Never  . Sexual activity: Not Currently  Other Topics Concern  . Not on file  Social History Narrative  . Not on file   Social Determinants of Health   Financial Resource Strain:   . Difficulty of Paying Living Expenses:   Food Insecurity:   . Worried About Charity fundraiser in the Last Year:   . Arboriculturist in the Last Year:   Transportation Needs:   . Film/video editor (Medical):   Marland Kitchen Lack of Transportation (Non-Medical):   Physical Activity:   . Days of Exercise per Week:   . Minutes of Exercise per Session:   Stress:   . Feeling of Stress :   Social Connections:   . Frequency of Communication with Friends and Family:   . Frequency of Social Gatherings with Friends and  Family:   . Attends Religious Services:   . Active Member of Clubs or Organizations:   . Attends Archivist Meetings:   Marland Kitchen Marital Status:      Family History: The patient's family history includes Heart Problems in his father; Heart failure in his mother. There is no history of Heart attack, Heart disease, Stroke, Coronary artery disease, or Congestive Heart Failure. ROS:   Please see the history of present illness.    All other systems reviewed and are negative.  EKGs/Labs/Other Studies  Reviewed:    The following studies were reviewed today: Lower extremity venous duplex bilaterally no findings of DVT  Recent Labs: 12/17/2019: ALT 6; BUN 37; Creatinine, Ser 2.00; NT-Pro BNP 4,356; Potassium 4.3; Sodium 137  Recent Lipid Panel    Component Value Date/Time   CHOL 114 12/17/2019 0940   TRIG 82 12/17/2019 0940   HDL 46 12/17/2019 0940   CHOLHDL 2.5 12/17/2019 0940   LDLCALC 52 12/17/2019 0940    Physical Exam:    VS:  BP 128/60 (BP Location: Right Arm, Patient Position: Sitting, Cuff Size: Normal)   Pulse 60   Ht 5\' 10"  (1.778 m)   Wt 193 lb 6.4 oz (87.7 kg)   SpO2 95%   BMI 27.75 kg/m     Wt Readings from Last 3 Encounters:  04/06/20 193 lb 6.4 oz (87.7 kg)  12/03/19 191 lb (86.6 kg)  06/01/19 187 lb 3.2 oz (84.9 kg)     GEN: He looks frail well nourished, well developed in no acute distress HEENT: Normal NECK: No JVD; No carotid bruits LYMPHATICS: No lymphadenopathy CARDIAC: RRR, no murmurs, rubs, gallops RESPIRATORY:  Clear to auscultation without rales, wheezing or rhonchi  ABDOMEN: Soft, non-tender, non-distended MUSCULOSKELETAL: He has bilateral lower extremity 4+ pitting above the knee and presacral edema; No deformity  SKIN: Warm and dry NEUROLOGIC:  Alert and oriented x 3 PSYCHIATRIC:  Normal affect    Signed, Shirlee More, MD  04/06/2020 12:23 PM    [facility]

## 2020-04-05 NOTE — Telephone Encounter (Signed)
Spoke with patients daughter regarding results and recommendation.  She verbalizes understanding and is agreeable to plan of care. Advised patient to call back with any issues or concerns.  

## 2020-04-06 ENCOUNTER — Other Ambulatory Visit: Payer: Self-pay

## 2020-04-06 ENCOUNTER — Ambulatory Visit (INDEPENDENT_AMBULATORY_CARE_PROVIDER_SITE_OTHER): Payer: Medicare Other | Admitting: Cardiology

## 2020-04-06 ENCOUNTER — Encounter: Payer: Self-pay | Admitting: Cardiology

## 2020-04-06 VITALS — BP 128/60 | HR 60 | Ht 70.0 in | Wt 193.4 lb

## 2020-04-06 DIAGNOSIS — R0602 Shortness of breath: Secondary | ICD-10-CM

## 2020-04-06 DIAGNOSIS — I350 Nonrheumatic aortic (valve) stenosis: Secondary | ICD-10-CM | POA: Diagnosis not present

## 2020-04-06 DIAGNOSIS — I110 Hypertensive heart disease with heart failure: Secondary | ICD-10-CM

## 2020-04-06 DIAGNOSIS — I255 Ischemic cardiomyopathy: Secondary | ICD-10-CM | POA: Diagnosis not present

## 2020-04-06 MED ORDER — METOLAZONE 2.5 MG PO TABS: 2.5000 mg | ORAL_TABLET | Freq: Every day | ORAL | 0 refills | Status: DC

## 2020-04-06 MED ORDER — TORSEMIDE 20 MG PO TABS: 20.0000 mg | ORAL_TABLET | Freq: Two times a day (BID) | ORAL | 3 refills | Status: DC

## 2020-04-06 NOTE — Patient Instructions (Addendum)
Medication Instructions:  Your physician has recommended you make the following change in your medication:  STOP: Furosemide START: Torsemide 20 mg take one tablet by mouth twice daily START: Metolazone 2.5 mg take one tablet by mouth daily for two days. Please take this 30 minutes prior to your torsemide in the morning.  *If you need a refill on your cardiac medications before your next appointment, please call your pharmacy*   Lab Work: Your physician recommends that you return for lab work in: TODAY BMP, ProBNP  Then on Tuesday please come back so we can check your ProBNP again.   If you have labs (blood work) drawn today and your tests are completely normal, you will receive your results only by: Marland Kitchen MyChart Message (if you have MyChart) OR . A paper copy in the mail If you have any lab test that is abnormal or we need to change your treatment, we will call you to review the results.   Testing/Procedures: Your physician has requested that you have an echocardiogram. Echocardiography is a painless test that uses sound waves to create images of your heart. It provides your doctor with information about the size and shape of your heart and how well your heart's chambers and valves are working. This procedure takes approximately one hour. There are no restrictions for this procedure.     Follow-Up: At [facility], you and your health needs are our priority.  As part of our continuing mission to provide you with exceptional heart care, we have created designated Provider Care Teams.  These Care Teams include your primary Cardiologist (physician) and Advanced Practice Providers (APPs -  Physician Assistants and Nurse Practitioners) who all work together to provide you with the care you need, when you need it.  We recommend signing up for the patient portal called "MyChart".  Sign up information is provided on this After Visit Summary.  MyChart is used to connect with patients for Virtual  Visits (Telemedicine).  Patients are able to view lab/test results, encounter notes, upcoming appointments, etc.  Non-urgent messages can be sent to your provider as well.   To learn more about what you can do with MyChart, go to [email or web address].    Your next appointment:   1 week(s)  The format for your next appointment:   In Person  Provider:   Shirlee More, MD   Other Instructions

## 2020-04-07 ENCOUNTER — Telehealth: Payer: Self-pay

## 2020-04-07 LAB — BASIC METABOLIC PANEL
BUN/Creatinine Ratio: 21 (ref 10–24)
BUN: 46 mg/dL — ABNORMAL HIGH (ref 10–36)
CO2: 29 mmol/L (ref 20–29)
Calcium: 8.7 mg/dL (ref 8.6–10.2)
Chloride: 98 mmol/L (ref 96–106)
Creatinine, Ser: 2.23 mg/dL — ABNORMAL HIGH (ref 0.76–1.27)
GFR calc Af Amer: 28 mL/min/{1.73_m2} — ABNORMAL LOW (ref >59)
GFR calc non Af Amer: 24 mL/min/{1.73_m2} — ABNORMAL LOW (ref >59)
Glucose: 97 mg/dL (ref 65–99)
Potassium: 4.4 mmol/L (ref 3.5–5.2)
Sodium: 141 mmol/L (ref 134–144)

## 2020-04-07 LAB — PRO B NATRIURETIC PEPTIDE: NT-Pro BNP: 7069 pg/mL — ABNORMAL HIGH (ref 0–486)

## 2020-04-07 NOTE — Telephone Encounter (Signed)
Spoke with patients daughter regarding results and recommendation.  She verbalizes understanding and is agreeable to plan of care. Advised for the patient to call back with any issues or concerns.

## 2020-04-13 ENCOUNTER — Ambulatory Visit (INDEPENDENT_AMBULATORY_CARE_PROVIDER_SITE_OTHER): Payer: Medicare Other | Admitting: Cardiology

## 2020-04-13 ENCOUNTER — Other Ambulatory Visit: Payer: Self-pay

## 2020-04-13 VITALS — BP 163/58 | HR 60 | Ht 70.0 in | Wt 190.0 lb

## 2020-04-13 DIAGNOSIS — I5033 Acute on chronic diastolic (congestive) heart failure: Secondary | ICD-10-CM

## 2020-04-13 DIAGNOSIS — Z951 Presence of aortocoronary bypass graft: Secondary | ICD-10-CM | POA: Diagnosis not present

## 2020-04-13 DIAGNOSIS — I2510 Atherosclerotic heart disease of native coronary artery without angina pectoris: Secondary | ICD-10-CM | POA: Diagnosis not present

## 2020-04-13 DIAGNOSIS — Z79899 Other long term (current) drug therapy: Secondary | ICD-10-CM | POA: Diagnosis not present

## 2020-04-13 DIAGNOSIS — I110 Hypertensive heart disease with heart failure: Secondary | ICD-10-CM

## 2020-04-13 DIAGNOSIS — I5043 Acute on chronic combined systolic (congestive) and diastolic (congestive) heart failure: Secondary | ICD-10-CM | POA: Diagnosis present

## 2020-04-13 DIAGNOSIS — R54 Age-related physical debility: Secondary | ICD-10-CM | POA: Diagnosis not present

## 2020-04-13 DIAGNOSIS — I350 Nonrheumatic aortic (valve) stenosis: Secondary | ICD-10-CM | POA: Diagnosis present

## 2020-04-13 DIAGNOSIS — K219 Gastro-esophageal reflux disease without esophagitis: Secondary | ICD-10-CM | POA: Diagnosis present

## 2020-04-13 DIAGNOSIS — E785 Hyperlipidemia, unspecified: Secondary | ICD-10-CM

## 2020-04-13 DIAGNOSIS — I130 Hypertensive heart and chronic kidney disease with heart failure and stage 1 through stage 4 chronic kidney disease, or unspecified chronic kidney disease: Secondary | ICD-10-CM | POA: Diagnosis present

## 2020-04-13 DIAGNOSIS — E7800 Pure hypercholesterolemia, unspecified: Secondary | ICD-10-CM | POA: Diagnosis present

## 2020-04-13 DIAGNOSIS — N184 Chronic kidney disease, stage 4 (severe): Secondary | ICD-10-CM | POA: Diagnosis present

## 2020-04-13 DIAGNOSIS — E8779 Other fluid overload: Secondary | ICD-10-CM

## 2020-04-13 DIAGNOSIS — I2583 Coronary atherosclerosis due to lipid rich plaque: Secondary | ICD-10-CM | POA: Diagnosis present

## 2020-04-13 DIAGNOSIS — I340 Nonrheumatic mitral (valve) insufficiency: Secondary | ICD-10-CM | POA: Diagnosis not present

## 2020-04-13 DIAGNOSIS — Z7902 Long term (current) use of antithrombotics/antiplatelets: Secondary | ICD-10-CM | POA: Diagnosis not present

## 2020-04-13 DIAGNOSIS — R609 Edema, unspecified: Secondary | ICD-10-CM | POA: Diagnosis not present

## 2020-04-13 DIAGNOSIS — N179 Acute kidney failure, unspecified: Secondary | ICD-10-CM | POA: Diagnosis not present

## 2020-04-13 DIAGNOSIS — N400 Enlarged prostate without lower urinary tract symptoms: Secondary | ICD-10-CM | POA: Diagnosis present

## 2020-04-13 DIAGNOSIS — N170 Acute kidney failure with tubular necrosis: Secondary | ICD-10-CM | POA: Diagnosis not present

## 2020-04-13 DIAGNOSIS — I361 Nonrheumatic tricuspid (valve) insufficiency: Secondary | ICD-10-CM | POA: Diagnosis not present

## 2020-04-13 DIAGNOSIS — I10 Essential (primary) hypertension: Secondary | ICD-10-CM | POA: Diagnosis not present

## 2020-04-13 HISTORY — DX: Acute on chronic diastolic (congestive) heart failure: I50.33

## 2020-04-13 HISTORY — DX: Other fluid overload: E87.79

## 2020-04-13 NOTE — Patient Instructions (Signed)
Medication Instructions:  Your physician recommends that you continue on your current medications as directed. Please refer to the Current Medication list given to you today.  *If you need a refill on your cardiac medications before your next appointment, please call your pharmacy*   Lab Work: None If you have labs (blood work) drawn today and your tests are completely normal, you will receive your results only by: Marland Kitchen MyChart Message (if you have MyChart) OR . A paper copy in the mail If you have any lab test that is abnormal or we need to change your treatment, we will call you to review the results.   Testing/Procedures: None   Follow-Up: At [facility], you and your health needs are our priority.  As part of our continuing mission to provide you with exceptional heart care, we have created designated Provider Care Teams.  These Care Teams include your primary Cardiologist (physician) and Advanced Practice Providers (APPs -  Physician Assistants and Nurse Practitioners) who all work together to provide you with the care you need, when you need it.  We recommend signing up for the patient portal called "MyChart".  Sign up information is provided on this After Visit Summary.  MyChart is used to connect with patients for Virtual Visits (Telemedicine).  Patients are able to view lab/test results, encounter notes, upcoming appointments, etc.  Non-urgent messages can be sent to your provider as well.   To learn more about what you can do with MyChart, go to [email or web address].    Your next appointment:   1 month(s)  The format for your next appointment:   In Person  Provider:   Berniece Salines, DO   Other Instructions

## 2020-04-13 NOTE — Progress Notes (Signed)
Cardiology Office Note:    Date:  04/13/2020   ID:  Harry Klein, DOB [birth year], MRN [id number]  PCP:  Angelina Sheriff, MD  Cardiologist:  No primary care provider on file.  Electrophysiologist:  None   Referring MD: Angelina Sheriff, MD   " I am following up"  History of Present Illness:    Harry Klein is a 86 y.o. male with a hx of CAD status post CABG, heart failure with reduced ejection fraction with subsequent normalization, mild aortic stenosis, hypertension, CKD stage III-IV and hyperlipidemia.   The patient last seen on 04/06/2020 by Dr. Bettina Gavia.  At that time his furosemide was changed to torsemide, he was given Zaroxolyn for couple days.  Blood work was also done for kidney function electrolytes as well as proBNP.  The results has previously been called to the patient.   Today he is here with his daughter.  The patient tells me that he has not had any improvement with his change in medication.  Past Medical History:  Diagnosis Date  . BPH (benign prostatic hyperplasia) 04/13/2014  . Cardiomyopathy ([place]) 07/10/2015   Overview:  Ejection fraction 40% in 2015  . CHF (congestive heart failure) ([place])   . Chronic kidney disease, stage IV (severe) ([place]) 03/08/2016  . Coronary artery disease   . Dyslipidemia 07/10/2015  . Endoleak post (EVAR) endovascular aneurysm repair, subsequent encounter 11/16/2015  . Hyperlipidemia   . Hypertension   . Hypertensive heart disease with heart failure ([place]) 02/22/2016  . LV dysfunction 07/15/2017   Overview:  Last EF 40%  . Nocturia 04/13/2014  . Nonrheumatic aortic valve stenosis 03/08/2016   Overview:  Mild  . Presence of aortocoronary bypass graft 07/10/2015   Overview:  Done in 2002  . Urge incontinence 04/13/2014    Past Surgical History:  Procedure Laterality Date  . BACK SURGERY    . CARDIAC CATHETERIZATION    . CORONARY ARTERY BYPASS GRAFT    . REPLACEMENT TOTAL KNEE BILATERAL      Current Medications: Current Meds    Medication Sig  . amoxicillin (AMOXIL) 500 MG capsule TAKE 4 CAPSULES PRIOR TO DENTAL APPT  . clopidogrel (PLAVIX) 75 MG tablet Take 1 tablet (75 mg total) by mouth daily.  . cyanocobalamin (,VITAMIN B-12,) 1000 MCG/ML injection Inject 1,000 mcg into the skin every 30 (thirty) days.  . finasteride (PROSCAR) 5 MG tablet Take 1 tablet by mouth every other day.  . hydrALAZINE (APRESOLINE) 50 MG tablet TAKE 1 TABLET BY MOUTH THREE TIMES A DAY  . isosorbide dinitrate (ISORDIL) 20 MG tablet TAKE 1 TABLET BY MOUTH THREE TIMES A DAY  . metoprolol succinate (TOPROL-XL) 25 MG 24 hr tablet Take 1 tablet by mouth daily.  . Multiple Vitamins-Minerals (PRESERVISION AREDS 2 PO) Take 2 tablets by mouth daily.   . polyethylene glycol powder (GLYCOLAX/MIRALAX) powder Take 17 g by mouth daily.  . pravastatin (PRAVACHOL) 80 MG tablet TAKE 1 TABLET BY MOUTH EVERY DAY  . pyridOXINE (VITAMIN B-6) 100 MG tablet Take 100 mg by mouth daily.  . silodosin (RAPAFLO) 8 MG CAPS capsule Take 1 capsule by mouth daily.  Marland Kitchen torsemide (DEMADEX) 20 MG tablet Take 1 tablet (20 mg total) by mouth 2 (two) times daily.  . Vitamin D, Ergocalciferol, (DRISDOL) 50000 units CAPS capsule Take 50,000 Units by mouth once a week.     Allergies:   Patient has no known allergies.   Social History   Socioeconomic History  .  Marital status: Married    Spouse name: Not on file  . Number of children: Not on file  . Years of education: Not on file  . Highest education level: Not on file  Occupational History  . Not on file  Tobacco Use  . Smoking status: Former Smoker    Types: Cigarettes  . Smokeless tobacco: Never Used  Vaping Use  . Vaping Use: Never used  Substance and Sexual Activity  . Alcohol use: Never  . Drug use: Never  . Sexual activity: Not Currently  Other Topics Concern  . Not on file  Social History Narrative  . Not on file   Social Determinants of Health   Financial Resource Strain:   . Difficulty of Paying  Living Expenses:   Food Insecurity:   . Worried About Charity fundraiser in the Last Year:   . Arboriculturist in the Last Year:   Transportation Needs:   . Film/video editor (Medical):   Marland Kitchen Lack of Transportation (Non-Medical):   Physical Activity:   . Days of Exercise per Week:   . Minutes of Exercise per Session:   Stress:   . Feeling of Stress :   Social Connections:   . Frequency of Communication with Friends and Family:   . Frequency of Social Gatherings with Friends and Family:   . Attends Religious Services:   . Active Member of Clubs or Organizations:   . Attends Archivist Meetings:   Marland Kitchen Marital Status:      Family History: The patient's family history includes Heart Problems in his father; Heart failure in his mother. There is no history of Heart attack, Heart disease, Stroke, Coronary artery disease, or Congestive Heart Failure.  ROS:   Review of Systems  Constitution: Negative for decreased appetite, fever and weight gain.  HENT: Negative for congestion, ear discharge, hoarse voice and sore throat.   Eyes: Negative for discharge, redness, vision loss in right eye and visual halos.  Cardiovascular: Negative for chest pain, dyspnea on exertion, leg swelling, orthopnea and palpitations.  Respiratory: Negative for cough, hemoptysis, shortness of breath and snoring.   Endocrine: Negative for heat intolerance and polyphagia.  Hematologic/Lymphatic: Negative for bleeding problem. Does not bruise/bleed easily.  Skin: Negative for flushing, nail changes, rash and suspicious lesions.  Musculoskeletal: Negative for arthritis, joint pain, muscle cramps, myalgias, neck pain and stiffness.  Gastrointestinal: Negative for abdominal pain, bowel incontinence, diarrhea and excessive appetite.  Genitourinary: Negative for decreased libido, genital sores and incomplete emptying.  Neurological: Negative for brief paralysis, focal weakness, headaches and loss of balance.    Psychiatric/Behavioral: Negative for altered mental status, depression and suicidal ideas.  Allergic/Immunologic: Negative for HIV exposure and persistent infections.    EKGs/Labs/Other Studies Reviewed:    The following studies were reviewed today:   EKG: None today  Recent Labs: 12/17/2019: ALT 6 04/06/2020: BUN 46; Creatinine, Ser 2.23; NT-Pro BNP 7,069; Potassium 4.4; Sodium 141  Recent Lipid Panel    Component Value Date/Time   CHOL 114 12/17/2019 0940   TRIG 82 12/17/2019 0940   HDL 46 12/17/2019 0940   CHOLHDL 2.5 12/17/2019 0940   LDLCALC 52 12/17/2019 0940    Physical Exam:    VS:  BP (!) 163/58 (BP Location: Right Arm, Patient Position: Sitting, Cuff Size: Normal)   Pulse 60   Ht 5\' 10"  (1.778 m)   Wt 190 lb (86.2 kg)   SpO2 92%   BMI 27.26 kg/m  Wt Readings from Last 3 Encounters:  04/13/20 190 lb (86.2 kg)  04/06/20 193 lb 6.4 oz (87.7 kg)  12/03/19 191 lb (86.6 kg)     GEN: Well nourished, well developed in no acute distress HEENT: Normal NECK: No JVD; No carotid bruits LYMPHATICS: No lymphadenopathy CARDIAC: S1S2 noted,RRR, no murmurs, rubs, gallops RESPIRATORY: Mild bibasilar crackle, no wheezing or rhonchi  ABDOMEN: Soft, non-tender, non-distended, +bowel sounds, no guarding. EXTREMITIES: Bilateral up to knee +3 edema, No cyanosis, no clubbing MUSCULOSKELETAL:  No deformity  SKIN: Warm and dry NEUROLOGIC:  Alert and oriented x 3, non-focal PSYCHIATRIC:  Normal affect, good insight  ASSESSMENT:    1. Acute on chronic diastolic congestive heart failure ([place])   2. Nonrheumatic aortic valve stenosis   3. Cardiac volume overload   4. Hypertensive heart disease with heart failure ([place])   5. CAD in native artery   6. Dyslipidemia    PLAN:    The patient clinical exam do suggest volume overload.  At this time I think there is benefit for the patient to have IV diuretics.  I have advised the patient and his daughter that he needs to go to the  emergency department as he will need to be admitted for heart failure and IV diuretics.  Unlikely to repeat blood work at this time as blood work will be done as part of his work-up in the emergency department.  He is hypertensive in the office today.  I spoke with the ER doctor about the patient and why we have sent him to the emergency department.  He needs IV Lasix.  He may need a couple days of hospital admission to get back close to his baseline.  The patient is in agreement with the above plan. The patient left the office in stable condition.  The patient will follow up in 1 month with Dr. Bettina Gavia.   Medication Adjustments/Labs and Tests Ordered: Current medicines are reviewed at length with the patient today.  Concerns regarding medicines are outlined above.  No orders of the defined types were placed in this encounter.  No orders of the defined types were placed in this encounter.   Patient Instructions  Medication Instructions:  Your physician recommends that you continue on your current medications as directed. Please refer to the Current Medication list given to you today.  *If you need a refill on your cardiac medications before your next appointment, please call your pharmacy*   Lab Work: None If you have labs (blood work) drawn today and your tests are completely normal, you will receive your results only by: Marland Kitchen MyChart Message (if you have MyChart) OR . A paper copy in the mail If you have any lab test that is abnormal or we need to change your treatment, we will call you to review the results.   Testing/Procedures: None   Follow-Up: At [facility], you and your health needs are our priority.  As part of our continuing mission to provide you with exceptional heart care, we have created designated Provider Care Teams.  These Care Teams include your primary Cardiologist (physician) and Advanced Practice Providers (APPs -  Physician Assistants and Nurse Practitioners)  who all work together to provide you with the care you need, when you need it.  We recommend signing up for the patient portal called "MyChart".  Sign up information is provided on this After Visit Summary.  MyChart is used to connect with patients for Virtual Visits (Telemedicine).  Patients are able  to view lab/test results, encounter notes, upcoming appointments, etc.  Non-urgent messages can be sent to your provider as well.   To learn more about what you can do with MyChart, go to [email or web address].    Your next appointment:   1 month(s)  The format for your next appointment:   In Person  Provider:   Berniece Salines, DO   Other Instructions      Adopting a Healthy Lifestyle.  Know what a healthy weight is for you (roughly BMI <25) and aim to maintain this   Aim for 7+ servings of fruits and vegetables daily   65-80+ fluid ounces of water or unsweet tea for healthy kidneys   Limit to max 1 drink of alcohol per day; avoid smoking/tobacco   Limit animal fats in diet for cholesterol and heart health - choose grass fed whenever available   Avoid highly processed foods, and foods high in saturated/trans fats   Aim for low stress - take time to unwind and care for your mental health   Aim for 150 min of moderate intensity exercise weekly for heart health, and weights twice weekly for bone health   Aim for 7-9 hours of sleep daily   When it comes to diets, agreement about the perfect plan isnt easy to find, even among the experts. Experts at the [employer] developed an idea known as the Healthy Eating Plate. Just imagine a plate divided into logical, healthy portions.   The emphasis is on diet quality:   Load up on vegetables and fruits - one-half of your plate: Aim for color and variety, and remember that potatoes dont count.   Go for whole grains - one-quarter of your plate: Whole wheat, barley, wheat berries, quinoa, oats, brown rice, and foods  made with them. If you want pasta, go with whole wheat pasta.   Protein power - one-quarter of your plate: Fish, chicken, beans, and nuts are all healthy, versatile protein sources. Limit red meat.   The diet, however, does go beyond the plate, offering a few other suggestions.   Use healthy plant oils, such as olive, canola, soy, corn, sunflower and peanut. Check the labels, and avoid partially hydrogenated oil, which have unhealthy trans fats.   If youre thirsty, drink water. Coffee and tea are good in moderation, but skip sugary drinks and limit milk and dairy products to one or two daily servings.   The type of carbohydrate in the diet is more important than the amount. Some sources of carbohydrates, such as vegetables, fruits, whole grains, and beans-are healthier than others.   Finally, stay active  Signed, Berniece Salines, DO  04/13/2020 2:17 PM    [facility]

## 2020-04-14 DIAGNOSIS — R54 Age-related physical debility: Secondary | ICD-10-CM

## 2020-04-14 DIAGNOSIS — I10 Essential (primary) hypertension: Secondary | ICD-10-CM

## 2020-04-14 DIAGNOSIS — I2510 Atherosclerotic heart disease of native coronary artery without angina pectoris: Secondary | ICD-10-CM

## 2020-04-14 DIAGNOSIS — I350 Nonrheumatic aortic (valve) stenosis: Secondary | ICD-10-CM

## 2020-04-14 DIAGNOSIS — N184 Chronic kidney disease, stage 4 (severe): Secondary | ICD-10-CM

## 2020-04-14 DIAGNOSIS — I5043 Acute on chronic combined systolic (congestive) and diastolic (congestive) heart failure: Secondary | ICD-10-CM

## 2020-04-15 DIAGNOSIS — I5043 Acute on chronic combined systolic (congestive) and diastolic (congestive) heart failure: Secondary | ICD-10-CM

## 2020-04-15 DIAGNOSIS — I350 Nonrheumatic aortic (valve) stenosis: Secondary | ICD-10-CM

## 2020-04-15 DIAGNOSIS — N184 Chronic kidney disease, stage 4 (severe): Secondary | ICD-10-CM

## 2020-04-15 DIAGNOSIS — I2510 Atherosclerotic heart disease of native coronary artery without angina pectoris: Secondary | ICD-10-CM

## 2020-04-18 ENCOUNTER — Ambulatory Visit (INDEPENDENT_AMBULATORY_CARE_PROVIDER_SITE_OTHER): Payer: Medicare Other | Admitting: Cardiology

## 2020-04-18 ENCOUNTER — Encounter: Payer: Self-pay | Admitting: Cardiology

## 2020-04-18 ENCOUNTER — Other Ambulatory Visit: Payer: Self-pay

## 2020-04-18 VITALS — BP 130/58 | HR 60 | Ht 70.0 in | Wt 186.0 lb

## 2020-04-18 DIAGNOSIS — I350 Nonrheumatic aortic (valve) stenosis: Secondary | ICD-10-CM | POA: Diagnosis not present

## 2020-04-18 DIAGNOSIS — R54 Age-related physical debility: Secondary | ICD-10-CM | POA: Diagnosis not present

## 2020-04-18 DIAGNOSIS — I110 Hypertensive heart disease with heart failure: Secondary | ICD-10-CM

## 2020-04-18 DIAGNOSIS — N184 Chronic kidney disease, stage 4 (severe): Secondary | ICD-10-CM

## 2020-04-18 NOTE — Patient Instructions (Signed)
Medication Instructions:  Your physician recommends that you continue on your current medications as directed. Please refer to the Current Medication list given to you today.  If you weigh at home and your weight is above 183 lbs take an extra dose of your Bumex *If you need a refill on your cardiac medications before your next appointment, please call your pharmacy*   Lab Work: None If you have labs (blood work) drawn today and your tests are completely normal, you will receive your results only by: Marland Kitchen MyChart Message (if you have MyChart) OR . A paper copy in the mail If you have any lab test that is abnormal or we need to change your treatment, we will call you to review the results.   Testing/Procedures: None   Follow-Up: At [facility], you and your health needs are our priority.  As part of our continuing mission to provide you with exceptional heart care, we have created designated Provider Care Teams.  These Care Teams include your primary Cardiologist (physician) and Advanced Practice Providers (APPs -  Physician Assistants and Nurse Practitioners) who all work together to provide you with the care you need, when you need it.  We recommend signing up for the patient portal called "MyChart".  Sign up information is provided on this After Visit Summary.  MyChart is used to connect with patients for Virtual Visits (Telemedicine).  Patients are able to view lab/test results, encounter notes, upcoming appointments, etc.  Non-urgent messages can be sent to your provider as well.   To learn more about what you can do with MyChart, go to [email or web address].    Your next appointment:   6 month(s)  The format for your next appointment:   In Person  Provider:   Shirlee More, MD   Other Instructions

## 2020-04-18 NOTE — Progress Notes (Signed)
Cardiology Office Note:    Date:  04/18/2020   ID:  Harry Klein, DOB [birth year], MRN [id number]  PCP:  Angelina Sheriff, MD  Cardiologist:  Shirlee More, MD    Referring MD: Angelina Sheriff, MD    ASSESSMENT:    1. Hypertensive heart disease with heart failure ([place])   2. Nonrheumatic aortic valve stenosis   3. Chronic kidney disease, stage IV (severe) ([place])   4. Frail elderly    PLAN:    In order of problems listed above:  1. He is improved renal function stable has no edema and will discontinue his current loop diuretic and take an extra dose any day weighs 183 or greater.  That would be a 3 pound weight gain.  Our goal is to room avoid repeat hospitalization 2. His aortic stenosis has progressed approaching severe and is all the family in my opinion his frailty and CKD make him a poor candidate and would not advise TAVR 3. Stable   Next appointment: 6 weeks   Medication Adjustments/Labs and Tests Ordered: Current medicines are reviewed at length with the patient today.  Concerns regarding medicines are outlined above.  No orders of the defined types were placed in this encounter.  No orders of the defined types were placed in this encounter.   Chief Complaint  Patient presents with  . Hospitalization Follow-up    History of Present Illness:    Harry Klein is a 86 y.o. male with a hx of CAD with CABG, heart failure with EF reported 25% with subsequent normalization, aortic stenosis, hypertension CKD stage III-IV and hyperlipidemia  last seen 04/06/2020 he had failed outpatient treatment of heart failure and was admitted to [facility].  His last echocardiogram September 2020 showed moderate aortic stenosis Compliance with diet, lifestyle and medications: Yes  His daughter is present she is retired Marine scientist I reviewed the testing below with her.  Office weight down 7 pounds from weight down 10 pounds and markedly improved he has no edema shortness of  breath chest pain palpitation or syncope.  I reviewed the echocardiogram and told her my opinion I think is a very poor candidate and I would not advise TAVR.  He has severe stage IV CKD.   Ref Range & Units 12 d ago 4 mo ago 10 mo ago  NT-Pro BNP 0 - 486 pg/mL 7,069High  4,356High CM  2,957High    Ref Range & Units 12 d ago 4 mo ago 10 mo ago  Glucose 65 - 99 mg/dL 97  99  111High   BUN 10 - 36 mg/dL 46High  37High  37High   Creatinine, Ser 0.76 - 1.27 mg/dL 2.23High  2.00High  1.94High   GFR calc non Af Amer >59 mL/min/1.73 24Low  28Low  29Low   GFR calc Af Amer >59 mL/min/1.73 28Low  32Low  33Low    Discharge labs [facility] creatinine 2.10 potassium 3.7 GFR 29 cc proBNP level 8700 hemoglobin 11.1. Phillip Heal performed while at [facility] showing mild concentric LVH biatrial enlargement moderate to severe aortic stenosis with peak and mean gradients of 33/22 mmHg and a VTI ratio of 0.26.  He was seen by my partner Dr. Geraldo Klein and his consultation makes notation that he discussed heart failure and the results of the echocardiogram with the family and that he responded very well to diuretic therapy.  The discharge summary from the hospital said that he had a junctional rhythm I reviewed his  EKG and it sinus rhythm at 59 per minute nonspecific ST changes Past Medical History:  Diagnosis Date  . BPH (benign prostatic hyperplasia) 04/13/2014  . Cardiomyopathy ([place]) 07/10/2015   Overview:  Ejection fraction 40% in 2015  . CHF (congestive heart failure) ([place])   . Chronic kidney disease, stage IV (severe) ([place]) 03/08/2016  . Coronary artery disease   . Dyslipidemia 07/10/2015  . Endoleak post (EVAR) endovascular aneurysm repair, subsequent encounter 11/16/2015  . Hyperlipidemia   . Hypertension   . Hypertensive heart disease with heart failure ([place]) 02/22/2016  . LV dysfunction 07/15/2017   Overview:  Last EF 40%  . Nocturia 04/13/2014  . Nonrheumatic aortic valve  stenosis 03/08/2016   Overview:  Mild  . Presence of aortocoronary bypass graft 07/10/2015   Overview:  Done in 2002  . Urge incontinence 04/13/2014    Past Surgical History:  Procedure Laterality Date  . BACK SURGERY    . CARDIAC CATHETERIZATION    . CORONARY ARTERY BYPASS GRAFT    . REPLACEMENT TOTAL KNEE BILATERAL      Current Medications: Current Meds  Medication Sig  . amoxicillin (AMOXIL) 500 MG capsule TAKE 4 CAPSULES PRIOR TO DENTAL APPT  . bumetanide (BUMEX) 1 MG tablet Take 1 mg by mouth 2 (two) times daily.  . clopidogrel (PLAVIX) 75 MG tablet Take 1 tablet (75 mg total) by mouth daily.  . cyanocobalamin (,VITAMIN B-12,) 1000 MCG/ML injection Inject 1,000 mcg into the skin every 30 (thirty) days.  . finasteride (PROSCAR) 5 MG tablet Take 1 tablet by mouth every other day.  . hydrALAZINE (APRESOLINE) 50 MG tablet TAKE 1 TABLET BY MOUTH THREE TIMES A DAY  . isosorbide dinitrate (ISORDIL) 20 MG tablet TAKE 1 TABLET BY MOUTH THREE TIMES A DAY  . metoprolol succinate (TOPROL-XL) 25 MG 24 hr tablet Take 1 tablet by mouth daily.  . Multiple Vitamins-Minerals (PRESERVISION AREDS 2 PO) Take 2 tablets by mouth daily.   . polyethylene glycol powder (GLYCOLAX/MIRALAX) powder Take 17 g by mouth daily.  . potassium chloride (MICRO-K) 10 MEQ CR capsule Take 10 mEq by mouth daily.  . pravastatin (PRAVACHOL) 80 MG tablet TAKE 1 TABLET BY MOUTH EVERY DAY  . pyridOXINE (VITAMIN B-6) 100 MG tablet Take 100 mg by mouth daily.  . silodosin (RAPAFLO) 8 MG CAPS capsule Take 1 capsule by mouth daily.  . Vitamin D, Ergocalciferol, (DRISDOL) 50000 units CAPS capsule Take 50,000 Units by mouth once a week.     Allergies:   Patient has no known allergies.   Social History   Socioeconomic History  . Marital status: Married    Spouse name: Not on file  . Number of children: Not on file  . Years of education: Not on file  . Highest education level: Not on file  Occupational History  . Not on file   Tobacco Use  . Smoking status: Former Smoker    Types: Cigarettes  . Smokeless tobacco: Never Used  Vaping Use  . Vaping Use: Never used  Substance and Sexual Activity  . Alcohol use: Never  . Drug use: Never  . Sexual activity: Not Currently  Other Topics Concern  . Not on file  Social History Narrative  . Not on file   Social Determinants of Health   Financial Resource Strain:   . Difficulty of Paying Living Expenses:   Food Insecurity:   . Worried About Charity fundraiser in the Last Year:   . [employer] of  Food in the Last Year:   Transportation Needs:   . Film/video editor (Medical):   Marland Kitchen Lack of Transportation (Non-Medical):   Physical Activity:   . Days of Exercise per Week:   . Minutes of Exercise per Session:   Stress:   . Feeling of Stress :   Social Connections:   . Frequency of Communication with Friends and Family:   . Frequency of Social Gatherings with Friends and Family:   . Attends Religious Services:   . Active Member of Clubs or Organizations:   . Attends Archivist Meetings:   Marland Kitchen Marital Status:      Family History: The patient's family history includes Heart Problems in his father; Heart failure in his mother. There is no history of Heart attack, Heart disease, Stroke, Coronary artery disease, or Congestive Heart Failure. ROS:   Please see the history of present illness.    All other systems reviewed and are negative.  EKGs/Labs/Other Studies Reviewed:    The following studies were reviewed today:   Recent Labs: 12/17/2019: ALT 6 04/06/2020: BUN 46; Creatinine, Ser 2.23; NT-Pro BNP 7,069; Potassium 4.4; Sodium 141  Recent Lipid Panel    Component Value Date/Time   CHOL 114 12/17/2019 0940   TRIG 82 12/17/2019 0940   HDL 46 12/17/2019 0940   CHOLHDL 2.5 12/17/2019 0940   LDLCALC 52 12/17/2019 0940    Physical Exam:    VS:  BP (!) 130/58 (BP Location: Right Arm, Patient Position: Sitting, Cuff Size: Normal)   Pulse 60    Ht 5\' 10"  (1.778 m)   Wt 186 lb (84.4 kg)   SpO2 97%   BMI 26.69 kg/m     Wt Readings from Last 3 Encounters:  04/18/20 186 lb (84.4 kg)  04/13/20 190 lb (86.2 kg)  04/06/20 193 lb 6.4 oz (87.7 kg)     GEN: Appears frail well nourished, well developed in no acute distress HEENT: Normal NECK: No JVD; No carotid bruits LYMPHATICS: No lymphadenopathy CARDIAC: Harsh 3 of 6 late peaking aortic stenosis murmur encompasses S2 and radiates to the carotids RRR, RESPIRATORY:  Clear to auscultation without rales, wheezing or rhonchi  ABDOMEN: Soft, non-tender, non-distended MUSCULOSKELETAL:  No edema; No deformity  SKIN: Warm and dry NEUROLOGIC:  Alert and oriented x 3 PSYCHIATRIC:  Normal affect    Signed, Shirlee More, MD  04/18/2020 2:55 PM    [place]

## 2020-04-20 DIAGNOSIS — E785 Hyperlipidemia, unspecified: Secondary | ICD-10-CM | POA: Diagnosis not present

## 2020-04-20 DIAGNOSIS — I509 Heart failure, unspecified: Secondary | ICD-10-CM | POA: Diagnosis not present

## 2020-04-20 DIAGNOSIS — Z6828 Body mass index (BMI) 28.0-28.9, adult: Secondary | ICD-10-CM | POA: Diagnosis not present

## 2020-04-20 DIAGNOSIS — N189 Chronic kidney disease, unspecified: Secondary | ICD-10-CM | POA: Diagnosis not present

## 2020-04-20 DIAGNOSIS — E7800 Pure hypercholesterolemia, unspecified: Secondary | ICD-10-CM | POA: Diagnosis not present

## 2020-04-24 ENCOUNTER — Other Ambulatory Visit: Payer: Medicare Other

## 2020-05-04 ENCOUNTER — Telehealth: Payer: Self-pay | Admitting: Cardiology

## 2020-05-04 MED ORDER — POTASSIUM CHLORIDE ER 10 MEQ PO CPCR: 10.0000 meq | ORAL_CAPSULE | Freq: Every day | ORAL | 3 refills | Status: DC

## 2020-05-04 MED ORDER — BUMETANIDE 1 MG PO TABS: 1.0000 mg | ORAL_TABLET | Freq: Two times a day (BID) | ORAL | 3 refills | Status: DC

## 2020-05-04 NOTE — Telephone Encounter (Signed)
Harry Klein is calling requesting to speak with a nurse in regards to [place]. Please advise.

## 2020-05-04 NOTE — Telephone Encounter (Signed)
Spoke with patients daughter just now and she was requesting that a 90 day refill be sent in on the patients BUMEX. She also states that the patient is having to take an extra tablet of the BUMEX at least 3-4 times a week due to his weight being increased. She is wondering if his dosage should be increased at this time?  I will route this to Dr. Bettina Gavia for further recommendations.

## 2020-05-04 NOTE — Addendum Note (Signed)
Addended by: Resa Miner I on: 05/04/2020 02:03 PM   Modules accepted: Orders

## 2020-05-04 NOTE — Telephone Encounter (Addendum)
Spoke with patients daughter just now and let her know Dr. Oren Binet recommendation to leave the [facility] the way that it is. She verbalizes understanding and thanks me for the call back.    Encouraged patient to call back with any questions or concerns.

## 2020-05-04 NOTE — Telephone Encounter (Signed)
I would not change with his severe CKD

## 2020-05-11 DIAGNOSIS — L578 Other skin changes due to chronic exposure to nonionizing radiation: Secondary | ICD-10-CM | POA: Diagnosis not present

## 2020-05-11 DIAGNOSIS — L821 Other seborrheic keratosis: Secondary | ICD-10-CM | POA: Diagnosis not present

## 2020-05-11 DIAGNOSIS — C44212 Basal cell carcinoma of skin of right ear and external auricular canal: Secondary | ICD-10-CM | POA: Diagnosis not present

## 2020-05-11 DIAGNOSIS — L570 Actinic keratosis: Secondary | ICD-10-CM | POA: Diagnosis not present

## 2020-05-24 DIAGNOSIS — R52 Pain, unspecified: Secondary | ICD-10-CM | POA: Diagnosis not present

## 2020-05-24 DIAGNOSIS — R58 Hemorrhage, not elsewhere classified: Secondary | ICD-10-CM | POA: Diagnosis not present

## 2020-05-24 DIAGNOSIS — W19XXXA Unspecified fall, initial encounter: Secondary | ICD-10-CM | POA: Diagnosis not present

## 2020-05-24 DIAGNOSIS — I959 Hypotension, unspecified: Secondary | ICD-10-CM | POA: Diagnosis not present

## 2020-05-24 DIAGNOSIS — S0083XA Contusion of other part of head, initial encounter: Secondary | ICD-10-CM | POA: Diagnosis not present

## 2020-05-24 DIAGNOSIS — S199XXA Unspecified injury of neck, initial encounter: Secondary | ICD-10-CM | POA: Diagnosis not present

## 2020-05-24 DIAGNOSIS — S0003XA Contusion of scalp, initial encounter: Secondary | ICD-10-CM | POA: Diagnosis not present

## 2020-05-24 DIAGNOSIS — S59901A Unspecified injury of right elbow, initial encounter: Secondary | ICD-10-CM | POA: Diagnosis not present

## 2020-05-24 DIAGNOSIS — S0990XA Unspecified injury of head, initial encounter: Secondary | ICD-10-CM | POA: Diagnosis not present

## 2020-05-24 DIAGNOSIS — M5030 Other cervical disc degeneration, unspecified cervical region: Secondary | ICD-10-CM | POA: Diagnosis not present

## 2020-05-24 DIAGNOSIS — M25511 Pain in right shoulder: Secondary | ICD-10-CM | POA: Diagnosis not present

## 2020-05-24 DIAGNOSIS — S4991XA Unspecified injury of right shoulder and upper arm, initial encounter: Secondary | ICD-10-CM | POA: Diagnosis not present

## 2020-05-26 ENCOUNTER — Telehealth: Payer: Self-pay | Admitting: *Deleted

## 2020-05-26 MED ORDER — PRAVASTATIN SODIUM 80 MG PO TABS: 80.0000 mg | ORAL_TABLET | Freq: Every day | ORAL | 1 refills | Status: DC

## 2020-05-26 NOTE — Telephone Encounter (Signed)
Rx refill sent to pharmacy. 

## 2020-05-29 ENCOUNTER — Telehealth: Payer: Self-pay | Admitting: Cardiology

## 2020-05-29 MED ORDER — PRAVASTATIN SODIUM 80 MG PO TABS: 80.0000 mg | ORAL_TABLET | Freq: Every day | ORAL | 1 refills | Status: AC

## 2020-05-29 MED ORDER — BUMETANIDE 1 MG PO TABS: ORAL_TABLET | ORAL | 3 refills | Status: DC

## 2020-05-29 NOTE — Telephone Encounter (Signed)
Wanted to talk with Dr. Joya Gaskins nurse about medication that he has to twice daily. Name of medication is bumetanide (BUMEX) 1 MG tablet.

## 2020-05-29 NOTE — Telephone Encounter (Signed)
Refill sent in per request.  

## 2020-05-29 NOTE — Telephone Encounter (Signed)
Spoke with patients daughter just now and let her know Dr. Joya Gaskins recommendations. She verbalizes understanding and thanks me for the call back.

## 2020-05-29 NOTE — Telephone Encounter (Signed)
Spoke to the patients daughter and she had a question in regards to the patients Bumex. She states that Dr. Bettina Gavia asked for him to take an Extra Bumex tablet every day if his weight is over 183. She states that he is averaging around 184-185 lbs daily. Since he is weighing more than 183 lbs every day he is taking this extra tablet daily. She is wondering if the parameters need to be changed so that he is not taking an extra tablet every single day? She states that he is not having any increased shortness of breath and is able to do all of his normal activities. He does still have some bilateral leg swelling but she states that it is not any worse than normal.   I will route to Dr. Bettina Gavia for further advise.

## 2020-05-29 NOTE — Telephone Encounter (Signed)
*  STAT* If patient is at the pharmacy, call can be transferred to refill team.   1. Which medications need to be refilled? (please list name of each medication and dose if known) pravastatin (PRAVACHOL) 80 MG tablet   2. Which pharmacy/location (including street and city if local pharmacy) is medication to be sent to? [id number] - [place], [place] [address]   3. Do they need a 30 day or 90 day supply? [place]

## 2020-05-29 NOTE — Telephone Encounter (Signed)
Lets change this to just take an extra tablet on Monday Wednesday and Friday.  She can use her judgment any other day if she thinks that he needs it.

## 2020-05-29 NOTE — Addendum Note (Signed)
Addended by: Resa Miner I on: 05/29/2020 09:39 AM   Modules accepted: Orders

## 2020-05-30 ENCOUNTER — Telehealth: Payer: Self-pay | Admitting: Cardiology

## 2020-05-30 MED ORDER — METOPROLOL SUCCINATE ER 25 MG PO TB24: 25.0000 mg | ORAL_TABLET | Freq: Every day | ORAL | 3 refills | Status: AC

## 2020-05-30 NOTE — Telephone Encounter (Signed)
Spoke to patients daughter just now and she let me know that they were needing a prescription for the patients metoprolol sent in to the pharmacy. I sent this in for them at this time.    Encouraged patient to call back with any questions or concerns.

## 2020-05-30 NOTE — Telephone Encounter (Signed)
Harry Klein is calling requesting Lilia Pro give her a call in regards to Jamaal's metoprolol refill. She did not specify any further what it was in regards to. Please advise.

## 2020-06-08 ENCOUNTER — Telehealth: Payer: Self-pay | Admitting: Cardiology

## 2020-06-08 DIAGNOSIS — Z79899 Other long term (current) drug therapy: Secondary | ICD-10-CM

## 2020-06-08 DIAGNOSIS — I5033 Acute on chronic diastolic (congestive) heart failure: Secondary | ICD-10-CM

## 2020-06-08 NOTE — Telephone Encounter (Signed)
Spoke to patients daughter just now and let her know that per Dr. Bettina Gavia the patient can take his [place] two tablets in the morning and one tablet every evening every day until his weight comes back down to normal. She verbalizes understanding and thanks me for the call back.

## 2020-06-08 NOTE — Telephone Encounter (Signed)
Pt c/o swelling: STAT is pt has developed SOB within 24 hours  1) How much weight have you gained and in what time span? Patient's daughter states the patient gained about 2.5 lbs in 2 days  2) If swelling, where is the swelling located? Both feet and legs  3) Are you currently taking a fluid pill? Yes  4) Are you currently SOB? No   5) Do you have a log of your daily weights (if so, list)?  06/05/20: 186.5 lbs 06/06/20: 187 lbs 06/07/20: 187 lbs 06/08/20: 187 lbs 6) Have you gained 3 pounds in a day or 5 pounds in a week? No  7) Have you traveled recently? No

## 2020-06-13 NOTE — Telephone Encounter (Signed)
Is okay to have her added on the schedule on proceed.  Please also have him come get BMP, proBNP and magnesium levels hopefully tomorrow before her visit so that way we can be able to get kidney functions as well.

## 2020-06-13 NOTE — Telephone Encounter (Signed)
Patient's daughter is calling to report additional weight gain.  Pt c/o swelling: STAT is pt has developed SOB within 24 hours  1) How much weight have you gained and in what time span? .8 lbs in 1 day  2) If swelling, where is the swelling located? Both feet and legs  3) Are you currently taking a fluid pill? Yes   4) Are you currently SOB? No   5) Do you have a log of your daily weights (if so, list)?  06/12/20: 187.8 lbs 06/13/20: 188.6 lbs  6) Have you gained 3 pounds in a day or 5 pounds in a week? No   7) Have you traveled recently? No

## 2020-06-13 NOTE — Telephone Encounter (Signed)
Called and spoke to patient daughter Jackelyn Poling per dpr. Advised her that we need patient to come in for labs if so before appointment on Thursday. She verbally agreed and will bring him tomorrow. No further questions.

## 2020-06-13 NOTE — Addendum Note (Signed)
Addended by: Ashok Norris on: 06/13/2020 10:35 AM   Modules accepted: Orders

## 2020-06-13 NOTE — Telephone Encounter (Signed)
Called and spoke to patient's daughter per dpr. She reports the patient is still taking bumex 1 mg two tablets in the morning and 1 tablet in the pm. However patient is still not at his normal weight he is normally 183 and is 187 right now. He is also still short of breath on exertion. They would like patient to be seen this week, Dr. Bettina Gavia is on vacation this week therefore patient was added to Dr. Harriet Masson schedule this week. Will consult with Dr. Harriet Masson if she has any further recommendations before his appointment on Thursday.

## 2020-06-14 DIAGNOSIS — I5033 Acute on chronic diastolic (congestive) heart failure: Secondary | ICD-10-CM | POA: Diagnosis not present

## 2020-06-14 DIAGNOSIS — Z79899 Other long term (current) drug therapy: Secondary | ICD-10-CM | POA: Diagnosis not present

## 2020-06-15 ENCOUNTER — Encounter: Payer: Self-pay | Admitting: Cardiology

## 2020-06-15 ENCOUNTER — Other Ambulatory Visit: Payer: Self-pay

## 2020-06-15 ENCOUNTER — Ambulatory Visit (INDEPENDENT_AMBULATORY_CARE_PROVIDER_SITE_OTHER): Payer: Medicare Other | Admitting: Cardiology

## 2020-06-15 VITALS — BP 132/61 | HR 60 | Ht 70.0 in | Wt 193.8 lb

## 2020-06-15 DIAGNOSIS — I110 Hypertensive heart disease with heart failure: Secondary | ICD-10-CM | POA: Diagnosis not present

## 2020-06-15 DIAGNOSIS — I350 Nonrheumatic aortic (valve) stenosis: Secondary | ICD-10-CM | POA: Diagnosis not present

## 2020-06-15 DIAGNOSIS — D539 Nutritional anemia, unspecified: Secondary | ICD-10-CM | POA: Insufficient documentation

## 2020-06-15 DIAGNOSIS — M5030 Other cervical disc degeneration, unspecified cervical region: Secondary | ICD-10-CM | POA: Insufficient documentation

## 2020-06-15 DIAGNOSIS — R42 Dizziness and giddiness: Secondary | ICD-10-CM | POA: Insufficient documentation

## 2020-06-15 DIAGNOSIS — I10 Essential (primary) hypertension: Secondary | ICD-10-CM

## 2020-06-15 DIAGNOSIS — N179 Acute kidney failure, unspecified: Secondary | ICD-10-CM | POA: Insufficient documentation

## 2020-06-15 DIAGNOSIS — N184 Chronic kidney disease, stage 4 (severe): Secondary | ICD-10-CM | POA: Diagnosis not present

## 2020-06-15 DIAGNOSIS — I5032 Chronic diastolic (congestive) heart failure: Secondary | ICD-10-CM | POA: Diagnosis not present

## 2020-06-15 DIAGNOSIS — I2510 Atherosclerotic heart disease of native coronary artery without angina pectoris: Secondary | ICD-10-CM | POA: Diagnosis not present

## 2020-06-15 DIAGNOSIS — K219 Gastro-esophageal reflux disease without esophagitis: Secondary | ICD-10-CM | POA: Insufficient documentation

## 2020-06-15 DIAGNOSIS — I509 Heart failure, unspecified: Secondary | ICD-10-CM

## 2020-06-15 DIAGNOSIS — W19XXXA Unspecified fall, initial encounter: Secondary | ICD-10-CM | POA: Insufficient documentation

## 2020-06-15 DIAGNOSIS — R29898 Other symptoms and signs involving the musculoskeletal system: Secondary | ICD-10-CM | POA: Insufficient documentation

## 2020-06-15 DIAGNOSIS — K5900 Constipation, unspecified: Secondary | ICD-10-CM | POA: Insufficient documentation

## 2020-06-15 DIAGNOSIS — R600 Localized edema: Secondary | ICD-10-CM | POA: Insufficient documentation

## 2020-06-15 DIAGNOSIS — S0003XA Contusion of scalp, initial encounter: Secondary | ICD-10-CM | POA: Insufficient documentation

## 2020-06-15 DIAGNOSIS — M25511 Pain in right shoulder: Secondary | ICD-10-CM | POA: Insufficient documentation

## 2020-06-15 DIAGNOSIS — R54 Age-related physical debility: Secondary | ICD-10-CM | POA: Insufficient documentation

## 2020-06-15 HISTORY — DX: Heart failure, unspecified: I50.9

## 2020-06-15 HISTORY — DX: Other cervical disc degeneration, unspecified cervical region: M50.30

## 2020-06-15 HISTORY — DX: Other symptoms and signs involving the musculoskeletal system: R29.898

## 2020-06-15 HISTORY — DX: Gastro-esophageal reflux disease without esophagitis: K21.9

## 2020-06-15 HISTORY — DX: Essential (primary) hypertension: I10

## 2020-06-15 LAB — BASIC METABOLIC PANEL
BUN/Creatinine Ratio: 22 (ref 10–24)
BUN: 48 mg/dL — ABNORMAL HIGH (ref 10–36)
CO2: 28 mmol/L (ref 20–29)
Calcium: 8.8 mg/dL (ref 8.6–10.2)
Chloride: 99 mmol/L (ref 96–106)
Creatinine, Ser: 2.14 mg/dL — ABNORMAL HIGH (ref 0.76–1.27)
GFR calc Af Amer: 29 mL/min/{1.73_m2} — ABNORMAL LOW (ref >59)
GFR calc non Af Amer: 25 mL/min/{1.73_m2} — ABNORMAL LOW (ref >59)
Glucose: 102 mg/dL — ABNORMAL HIGH (ref 65–99)
Potassium: 4.8 mmol/L (ref 3.5–5.2)
Sodium: 138 mmol/L (ref 134–144)

## 2020-06-15 LAB — MAGNESIUM: Magnesium: 2.7 mg/dL — ABNORMAL HIGH (ref 1.6–2.3)

## 2020-06-15 LAB — PRO B NATRIURETIC PEPTIDE: NT-Pro BNP: 8033 pg/mL — ABNORMAL HIGH (ref 0–486)

## 2020-06-15 MED ORDER — METOLAZONE 5 MG PO TABS: 5.0000 mg | ORAL_TABLET | Freq: Every day | ORAL | 0 refills | Status: DC

## 2020-06-15 NOTE — Patient Instructions (Signed)
Medication Instructions:  Your physician has recommended you make the following change in your medication:  START: Metolazone 5 mg take one tablet by mouth daily for three days. Please take this 30 minutes prior to your Bumex.  INCREASE: Bumex 1 mg take two tablets by mouth daily in the morning and one tablet by mouth daily in the evening for the next 7 days. After these 7 days please return to taking Bumex as previously prescribed.  *If you need a refill on your cardiac medications before your next appointment, please call your pharmacy*   Lab Work: None If you have labs (blood work) drawn today and your tests are completely normal, you will receive your results only by: [place] [facility] MyChart Message (if you have MyChart) OR . A paper copy in the mail If you have any lab test that is abnormal or we need to change your treatment, we will call you to review the results.   Testing/Procedures: None   Follow-Up: At [facility], you and your health needs are our priority.  As part of our continuing mission to provide you with exceptional heart care, we have created designated Provider Care Teams.  These Care Teams include your primary Cardiologist (physician) and Advanced Practice Providers (APPs -  Physician Assistants and Nurse Practitioners) who all work together to provide you with the care you need, when you need it.  We recommend signing up for the patient portal called "MyChart".  Sign up information is provided on this After Visit Summary.  MyChart is used to connect with patients for Virtual Visits (Telemedicine).  Patients are able to view lab/test results, encounter notes, upcoming appointments, etc.  Non-urgent messages can be sent to your provider as well.   To learn more about what you can do with MyChart, go to [email or web address].    Your next appointment:   1 week(s)  The format for your next appointment:   In Person  Provider:   Berniece Salines, DO or Dr. Bettina Gavia   Other  Instructions

## 2020-06-15 NOTE — Progress Notes (Signed)
Cardiology Office Note:    Date:  06/15/2020   ID:  Harry Klein, DOB November 26, 1921, MRN [id number]  PCP:  Angelina Sheriff, MD  Cardiologist:  No primary care provider on file.  Electrophysiologist:  None   Referring MD: Angelina Sheriff, MD   " I have been having some weight gain"  History of Present Illness:    Harry Klein is a 86 y.o. male with a hx of coronary artery disease status post CABG, heart failure with improved ejection fraction, hypertension, aortic stenosis, chronic kidney disease and hyperlipidemia.  The patient was recently seen in July by Dr. Bettina Gavia.  At that time his dry weight was established to be 183.  His diuretics was also optimized.  He is here today with his daughter for follow-up visit.  Appears that his weight has been increasing and most recently he is 189.  She has given him extra diuretic dosing but this does not seem to help.    Past Medical History:  Diagnosis Date   BPH (benign prostatic hyperplasia) 04/13/2014   Cardiomyopathy ([place]) 07/10/2015   Overview:  Ejection fraction 40% in 2015   CHF (congestive heart failure) (HCC)    Chronic kidney disease, stage IV (severe) ([place]) 03/08/2016   Coronary artery disease    Dyslipidemia 07/10/2015   Endoleak post (EVAR) endovascular aneurysm repair, subsequent encounter 11/16/2015   Hyperlipidemia    Hypertension    Hypertensive heart disease with heart failure ([place]) 02/22/2016   LV dysfunction 07/15/2017   Overview:  Last EF 40%   Nocturia 04/13/2014   Nonrheumatic aortic valve stenosis 03/08/2016   Overview:  Mild   Presence of aortocoronary bypass graft 07/10/2015   Overview:  Done in 2002   Urge incontinence 04/13/2014    Past Surgical History:  Procedure Laterality Date   BACK SURGERY     CARDIAC CATHETERIZATION     CORONARY ARTERY BYPASS GRAFT     REPLACEMENT TOTAL KNEE BILATERAL      Current Medications: Current Meds  Medication Sig   bumetanide (BUMEX) 1 MG tablet Take  one tablet by mouth twice daily. On Monday, Wednesday, and Friday take two tablets by mouth in the morning and one tablet by mouth in the evening.   clopidogrel (PLAVIX) 75 MG tablet Take 1 tablet (75 mg total) by mouth daily.   cyanocobalamin (,VITAMIN B-12,) 1000 MCG/ML injection Inject 1,000 mcg into the skin every 30 (thirty) days.   finasteride (PROSCAR) 5 MG tablet Take 1 tablet by mouth every other day.   hydrALAZINE (APRESOLINE) 50 MG tablet TAKE 1 TABLET BY MOUTH THREE TIMES A DAY   isosorbide dinitrate (ISORDIL) 20 MG tablet TAKE 1 TABLET BY MOUTH THREE TIMES A DAY   metoprolol succinate (TOPROL-XL) 25 MG 24 hr tablet Take 1 tablet (25 mg total) by mouth daily.   Multiple Vitamins-Minerals (PRESERVISION AREDS 2 PO) Take 2 tablets by mouth daily.    polyethylene glycol powder (GLYCOLAX/MIRALAX) powder Take 17 g by mouth daily.   potassium chloride (MICRO-K) 10 MEQ CR capsule Take 1 capsule (10 mEq total) by mouth daily.   pravastatin (PRAVACHOL) 80 MG tablet Take 1 tablet (80 mg total) by mouth daily.   pyridOXINE (VITAMIN B-6) 100 MG tablet Take 100 mg by mouth daily.   silodosin (RAPAFLO) 8 MG CAPS capsule Take 1 capsule by mouth daily.   Vitamin D, Ergocalciferol, (DRISDOL) 50000 units CAPS capsule Take 50,000 Units by mouth once a week.  Allergies:   Patient has no known allergies.   Social History   Socioeconomic History   Marital status: Married    Spouse name: Not on file   Number of children: Not on file   Years of education: Not on file   Highest education level: Not on file  Occupational History   Not on file  Tobacco Use   Smoking status: Former Smoker    Types: Cigarettes   Smokeless tobacco: Never Used  Scientific laboratory technician Use: Never used  Substance and Sexual Activity   Alcohol use: Never   Drug use: Never   Sexual activity: Not Currently  Other Topics Concern   Not on file  Social History Narrative   Not on file   Social  Determinants of Health   Financial Resource Strain:    Difficulty of Paying Living Expenses: Not on file  Food Insecurity:    Worried About [place] in the Last Year: Not on file   [place] in the Last Year: Not on file  Transportation Needs:    Lack of Transportation (Medical): Not on file   Lack of Transportation (Non-Medical): Not on file  Physical Activity:    Days of Exercise per Week: Not on file   Minutes of Exercise per Session: Not on file  Stress:    Feeling of Stress : Not on file  Social Connections:    Frequency of Communication with Friends and Family: Not on file   Frequency of Social Gatherings with Friends and Family: Not on file   Attends Religious Services: Not on file   Active Member of Clubs or Organizations: Not on file   Attends Archivist Meetings: Not on file   Marital Status: Not on file     Family History: The patient's family history includes Heart Problems in his father; Heart failure in his mother. There is no history of Heart attack, Heart disease, Stroke, Coronary artery disease, or Congestive Heart Failure.  ROS:   Review of Systems  Constitution: Negative for decreased appetite, fever and weight gain.  HENT: Negative for congestion, ear discharge, hoarse voice and sore throat.   Eyes: Negative for discharge, redness, vision loss in right eye and visual halos.  Cardiovascular: Negative for chest pain, dyspnea on exertion, leg swelling, orthopnea and palpitations.  Respiratory: Negative for cough, hemoptysis, shortness of breath and snoring.   Endocrine: Negative for heat intolerance and polyphagia.  Hematologic/Lymphatic: Negative for bleeding problem. Does not bruise/bleed easily.  Skin: Negative for flushing, nail changes, rash and suspicious lesions.  Musculoskeletal: Negative for arthritis, joint pain, muscle cramps, myalgias, neck pain and stiffness.  Gastrointestinal: Negative for abdominal pain,  bowel incontinence, diarrhea and excessive appetite.  Genitourinary: Negative for decreased libido, genital sores and incomplete emptying.  Neurological: Negative for brief paralysis, focal weakness, headaches and loss of balance.  Psychiatric/Behavioral: Negative for altered mental status, depression and suicidal ideas.  Allergic/Immunologic: Negative for HIV exposure and persistent infections.    EKGs/Labs/Other Studies Reviewed:    The following studies were reviewed today:   EKG:  The ekg ordered today demonstrates sinus rhythm with intermittent junctional and occasional PVCs.   Recent Labs: 12/17/2019: ALT 6 06/14/2020: BUN 48; Creatinine, Ser 2.14; Magnesium 2.7; NT-Pro BNP 8,033; Potassium 4.8; Sodium 138  Recent Lipid Panel    Component Value Date/Time   CHOL 114 12/17/2019 0940   TRIG 82 12/17/2019 0940   HDL 46 12/17/2019 0940   CHOLHDL  2.5 12/17/2019 0940   LDLCALC 52 12/17/2019 0940    Physical Exam:    VS:  BP 132/61    Pulse 60    Ht 5\' 10"  (1.778 m)    Wt 193 lb 12.8 oz (87.9 kg)    SpO2 95%    BMI 27.81 kg/m     Wt Readings from Last 3 Encounters:  06/15/20 193 lb 12.8 oz (87.9 kg)  04/18/20 186 lb (84.4 kg)  04/13/20 190 lb (86.2 kg)     GEN: Well nourished, well developed in no acute distress HEENT: Normal NECK: No JVD; No carotid bruits LYMPHATICS: No lymphadenopathy CARDIAC: S1S2 noted,RRR, no murmurs, rubs, gallops RESPIRATORY:  Clear to auscultation without rales, wheezing or rhonchi  ABDOMEN: Soft, non-tender, non-distended, +bowel sounds, no guarding. EXTREMITIES: +2 bilateral leg edema, No cyanosis, no clubbing MUSCULOSKELETAL:  No deformity  SKIN: Warm and dry NEUROLOGIC:  Alert and oriented x 3, non-focal PSYCHIATRIC:  Normal affect, good insight  ASSESSMENT:    1. Essential hypertension   2. Chronic diastolic heart failure ([place])   3. Nonrheumatic aortic valve stenosis   4. Hypertensive heart disease with heart failure ([place])   5. CAD in  native artery   6. Chronic kidney disease, stage IV (severe) (HCC)    PLAN:     The patient is volume overloaded.  At this time I am going to increase his Bumex to 2 mg daily in the morning and 1 mg daily in the afternoon.  I am also going to give the patient metolazone 5 mg for 3 days 30 minutes before his Bumex dose.  Explained this to the patient and his as this will help with effective diuresis.  I am hoping that the oral diuretics can avoid repeat hospitalization.   The patient is in agreement with the above plan. The patient left the office in stable condition.  The patient will follow up in   Medication Adjustments/Labs and Tests Ordered: Current medicines are reviewed at length with the patient today.  Concerns regarding medicines are outlined above.  Orders Placed This Encounter  Procedures   EKG 12-Lead   Meds ordered this encounter  Medications   metolazone (ZAROXOLYN) 5 MG tablet    Sig: Take 1 tablet (5 mg total) by mouth daily.    Dispense:  3 tablet    Refill:  0    Patient Instructions  Medication Instructions:  Your physician has recommended you make the following change in your medication:  START: Metolazone 5 mg take one tablet by mouth daily for three days. Please take this 30 minutes prior to your Bumex.  INCREASE: Bumex 1 mg take two tablets by mouth daily in the morning and one tablet by mouth daily in the evening for the next 7 days. After these 7 days please return to taking Bumex as previously prescribed.  *If you need a refill on your cardiac medications before your next appointment, please call your pharmacy*   Lab Work: None If you have labs (blood work) drawn today and your tests are completely normal, you will receive your results only by:  [place] (if you have MyChart) OR  A paper copy in the mail If you have any lab test that is abnormal or we need to change your treatment, we will call you to review the  results.   Testing/Procedures: None   Follow-Up: At [facility], you and your health needs are our priority.  As part of our continuing mission to  provide you with exceptional heart care, we have created designated Provider Care Teams.  These Care Teams include your primary Cardiologist (physician) and Advanced Practice Providers (APPs -  Physician Assistants and Nurse Practitioners) who all work together to provide you with the care you need, when you need it.  We recommend signing up for the patient portal called "MyChart".  Sign up information is provided on this After Visit Summary.  MyChart is used to connect with patients for Virtual Visits (Telemedicine).  Patients are able to view lab/test results, encounter notes, upcoming appointments, etc.  Non-urgent messages can be sent to your provider as well.   To learn more about what you can do with MyChart, go to [email or web address].    Your next appointment:   1 week(s)  The format for your next appointment:   In Person  Provider:   Berniece Salines, DO or Dr. Bettina Gavia   Other Instructions      Adopting a Healthy Lifestyle.  Know what a healthy weight is for you (roughly BMI <25) and aim to maintain this   Aim for 7+ servings of fruits and vegetables daily   65-80+ fluid ounces of water or unsweet tea for healthy kidneys   Limit to max 1 drink of alcohol per day; avoid smoking/tobacco   Limit animal fats in diet for cholesterol and heart health - choose grass fed whenever available   Avoid highly processed foods, and foods high in saturated/trans fats   Aim for low stress - take time to unwind and care for your mental health   Aim for 150 min of moderate intensity exercise weekly for heart health, and weights twice weekly for bone health   Aim for 7-9 hours of sleep daily   When it comes to diets, agreement about the perfect plan isnt easy to find, even among the experts. Experts at the [employer] developed an idea known as the Healthy Eating Plate. Just imagine a plate divided into logical, healthy portions.   The emphasis is on diet quality:   Load up on vegetables and fruits - one-half of your plate: Aim for color and variety, and remember that potatoes dont count.   Go for whole grains - one-quarter of your plate: Whole wheat, barley, wheat berries, quinoa, oats, brown rice, and foods made with them. If you want pasta, go with whole wheat pasta.   Protein power - one-quarter of your plate: Fish, chicken, beans, and nuts are all healthy, versatile protein sources. Limit red meat.   The diet, however, does go beyond the plate, offering a few other suggestions.   Use healthy plant oils, such as olive, canola, soy, corn, sunflower and peanut. Check the labels, and avoid partially hydrogenated oil, which have unhealthy trans fats.   If youre thirsty, drink water. Coffee and tea are good in moderation, but skip sugary drinks and limit milk and dairy products to one or two daily servings.   The type of carbohydrate in the diet is more important than the amount. Some sources of carbohydrates, such as vegetables, fruits, whole grains, and beans-are healthier than others.   Finally, stay active  Signed, Berniece Salines, DO  06/15/2020 10:05 AM    [place]

## 2020-06-22 ENCOUNTER — Other Ambulatory Visit: Payer: Self-pay

## 2020-06-22 ENCOUNTER — Encounter: Payer: Self-pay | Admitting: Cardiology

## 2020-06-22 ENCOUNTER — Ambulatory Visit (INDEPENDENT_AMBULATORY_CARE_PROVIDER_SITE_OTHER): Payer: Medicare Other | Admitting: Cardiology

## 2020-06-22 VITALS — BP 122/50 | HR 58 | Ht 70.0 in | Wt 186.0 lb

## 2020-06-22 DIAGNOSIS — I110 Hypertensive heart disease with heart failure: Secondary | ICD-10-CM | POA: Diagnosis not present

## 2020-06-22 NOTE — Progress Notes (Signed)
Cardiology Office Note:    Date:  06/22/2020   ID:  Harry Klein, DOB [birth year], MRN [id number]  PCP:  Angelina Sheriff, MD  Cardiologist:  No primary care provider on file.  Electrophysiologist:  None   Referring MD: Angelina Sheriff, MD   Chief Complaint  Patient presents with   Follow-up    History of Present Illness:    Harry Klein is a 86 y.o. male with a hx of coronary artery disease status post CABG, heart failure with improved ejection fraction, hypertension, aortic stenosis, chronic kidney disease and hyperlipidemia.  I saw the patient June 15, 2020 at that time I increase his Bumex to 2 mg daily in the morning and 1 mg in the afternoon.  I also gave him metolazone 5 mg for 3 days 30 minutes before his Bumex doses.  He is here today with his daughter for follow-up visit.  They did bring his weights for the last several days since his last visit he has lost 10 pounds from 189-179.  His dry weight has been noted to be 183 pounds.  He is happy with his improvement.  Past Medical History:  Diagnosis Date   BPH (benign prostatic hyperplasia) 04/13/2014   Cardiomyopathy ([place]) 07/10/2015   Overview:  Ejection fraction 40% in 2015   CHF (congestive heart failure) (HCC)    Chronic kidney disease, stage IV (severe) ([place]) 03/08/2016   Coronary artery disease    Dyslipidemia 07/10/2015   Endoleak post (EVAR) endovascular aneurysm repair, subsequent encounter 11/16/2015   Hyperlipidemia    Hypertension    Hypertensive heart disease with heart failure ([place]) 02/22/2016   LV dysfunction 07/15/2017   Overview:  Last EF 40%   Nocturia 04/13/2014   Nonrheumatic aortic valve stenosis 03/08/2016   Overview:  Mild   Presence of aortocoronary bypass graft 07/10/2015   Overview:  Done in 2002   Urge incontinence 04/13/2014    Past Surgical History:  Procedure Laterality Date   BACK SURGERY     CARDIAC CATHETERIZATION     CORONARY ARTERY BYPASS GRAFT      REPLACEMENT TOTAL KNEE BILATERAL      Current Medications: Current Meds  Medication Sig   bumetanide (BUMEX) 1 MG tablet Take one tablet by mouth twice daily. On Monday, Wednesday, and Friday take two tablets by mouth in the morning and one tablet by mouth in the evening.   clopidogrel (PLAVIX) 75 MG tablet Take 1 tablet (75 mg total) by mouth daily.   cyanocobalamin (,VITAMIN B-12,) 1000 MCG/ML injection Inject 1,000 mcg into the skin every 30 (thirty) days.   finasteride (PROSCAR) 5 MG tablet Take 1 tablet by mouth every other day.   hydrALAZINE (APRESOLINE) 50 MG tablet TAKE 1 TABLET BY MOUTH THREE TIMES A DAY   isosorbide dinitrate (ISORDIL) 20 MG tablet TAKE 1 TABLET BY MOUTH THREE TIMES A DAY   metolazone (ZAROXOLYN) 5 MG tablet Take 1 tablet (5 mg total) by mouth daily.   metoprolol succinate (TOPROL-XL) 25 MG 24 hr tablet Take 1 tablet (25 mg total) by mouth daily.   Multiple Vitamins-Minerals (PRESERVISION AREDS 2 PO) Take 2 tablets by mouth daily.    polyethylene glycol powder (GLYCOLAX/MIRALAX) powder Take 17 g by mouth daily.   potassium chloride (MICRO-K) 10 MEQ CR capsule Take 1 capsule (10 mEq total) by mouth daily.   pravastatin (PRAVACHOL) 80 MG tablet Take 1 tablet (80 mg total) by mouth daily.   pyridOXINE (VITAMIN B-6)  100 MG tablet Take 100 mg by mouth daily.   silodosin (RAPAFLO) 8 MG CAPS capsule Take 1 capsule by mouth daily.   Vitamin D, Ergocalciferol, (DRISDOL) 50000 units CAPS capsule Take 50,000 Units by mouth once a week.     Allergies:   Patient has no known allergies.   Social History   Socioeconomic History   Marital status: Married    Spouse name: Not on file   Number of children: Not on file   Years of education: Not on file   Highest education level: Not on file  Occupational History   Not on file  Tobacco Use   Smoking status: Former Smoker    Types: Cigarettes   Smokeless tobacco: Never Used  Scientific laboratory technician Use:  Never used  Substance and Sexual Activity   Alcohol use: Never   Drug use: Never   Sexual activity: Not Currently  Other Topics Concern   Not on file  Social History Narrative   Not on file   Social Determinants of Health   Financial Resource Strain:    Difficulty of Paying Living Expenses: Not on file  Food Insecurity:    Worried About [place] in the Last Year: Not on file   [employer] in the Last Year: Not on file  Transportation Needs:    Lack of Transportation (Medical): Not on file   Lack of Transportation (Non-Medical): Not on file  Physical Activity:    Days of Exercise per Week: Not on file   Minutes of Exercise per Session: Not on file  Stress:    Feeling of Stress : Not on file  Social Connections:    Frequency of Communication with Friends and Family: Not on file   Frequency of Social Gatherings with Friends and Family: Not on file   Attends Religious Services: Not on file   Active Member of Clubs or Organizations: Not on file   Attends Archivist Meetings: Not on file   Marital Status: Not on file     Family History: The patient's family history includes Heart Problems in his father; Heart failure in his mother. There is no history of Heart attack, Heart disease, Stroke, Coronary artery disease, or Congestive Heart Failure.  ROS:   Review of Systems  Constitution: Negative for decreased appetite, fever and weight gain.  HENT: Negative for congestion, ear discharge, hoarse voice and sore throat.   Eyes: Negative for discharge, redness, vision loss in right eye and visual halos.  Cardiovascular: Negative for chest pain, dyspnea on exertion, leg swelling, orthopnea and palpitations.  Respiratory: Negative for cough, hemoptysis, shortness of breath and snoring.   Endocrine: Negative for heat intolerance and polyphagia.  Hematologic/Lymphatic: Negative for bleeding problem. Does not bruise/bleed easily.  Skin: Negative  for flushing, nail changes, rash and suspicious lesions.  Musculoskeletal: Negative for arthritis, joint pain, muscle cramps, myalgias, neck pain and stiffness.  Gastrointestinal: Negative for abdominal pain, bowel incontinence, diarrhea and excessive appetite.  Genitourinary: Negative for decreased libido, genital sores and incomplete emptying.  Neurological: Negative for brief paralysis, focal weakness, headaches and loss of balance.  Psychiatric/Behavioral: Negative for altered mental status, depression and suicidal ideas.  Allergic/Immunologic: Negative for HIV exposure and persistent infections.    EKGs/Labs/Other Studies Reviewed:    The following studies were reviewed today:   EKG: None today  Recent Labs: 12/17/2019: ALT 6 06/14/2020: BUN 48; Creatinine, Ser 2.14; Magnesium 2.7; NT-Pro BNP 8,033; Potassium 4.8; Sodium  138  Recent Lipid Panel    Component Value Date/Time   CHOL 114 12/17/2019 0940   TRIG 82 12/17/2019 0940   HDL 46 12/17/2019 0940   CHOLHDL 2.5 12/17/2019 0940   LDLCALC 52 12/17/2019 0940    Physical Exam:    VS:  BP (!) 122/50 (BP Location: Right Arm, Patient Position: Sitting, Cuff Size: Normal)    Pulse (!) 58    Ht 5\' 10"  (1.778 m)    Wt 186 lb (84.4 kg)    SpO2 95%    BMI 26.69 kg/m     Wt Readings from Last 3 Encounters:  06/22/20 186 lb (84.4 kg)  06/15/20 193 lb 12.8 oz (87.9 kg)  04/18/20 186 lb (84.4 kg)     GEN: Well nourished, well developed in no acute distress HEENT: Normal NECK: No JVD; No carotid bruits LYMPHATICS: No lymphadenopathy CARDIAC: S1S2 noted,RRR, no murmurs, rubs, gallops RESPIRATORY:  Clear to auscultation without rales, wheezing or rhonchi  ABDOMEN: Soft, non-tender, non-distended, +bowel sounds, no guarding. EXTREMITIES: No edema, No cyanosis, no clubbing MUSCULOSKELETAL:  No deformity  SKIN: Warm and dry NEUROLOGIC:  Alert and oriented x 3, non-focal PSYCHIATRIC:  Normal affect, good insight  ASSESSMENT:    1.  Hypertensive heart disease with heart failure ([place])    PLAN:      For now keep the patient on his current dosing with Bumex 2 mg in the morning and 1 mg in the afternoon.  I have discussed with he and his daughter his dry weight is 183.  As long can keep him at or below his dry weight that will be a goal.  He will get blood work done today with BMP and mag stress test electrolytes and kidney function.  The patient is in agreement with the above plan. The patient left the office in stable condition.  The patient will follow up in 8 weeks with Dr. Bettina Gavia.   Medication Adjustments/Labs and Tests Ordered: Current medicines are reviewed at length with the patient today.  Concerns regarding medicines are outlined above.  No orders of the defined types were placed in this encounter.  No orders of the defined types were placed in this encounter.   There are no Patient Instructions on file for this visit.   Adopting a Healthy Lifestyle.  Know what a healthy weight is for you (roughly BMI <25) and aim to maintain this   Aim for 7+ servings of fruits and vegetables daily   65-80+ fluid ounces of water or unsweet tea for healthy kidneys   Limit to max 1 drink of alcohol per day; avoid smoking/tobacco   Limit animal fats in diet for cholesterol and heart health - choose grass fed whenever available   Avoid highly processed foods, and foods high in saturated/trans fats   Aim for low stress - take time to unwind and care for your mental health   Aim for 150 min of moderate intensity exercise weekly for heart health, and weights twice weekly for bone health   Aim for 7-9 hours of sleep daily   When it comes to diets, agreement about the perfect plan isnt easy to find, even among the experts. Experts at the [employer] developed an idea known as the Healthy Eating Plate. Just imagine a plate divided into logical, healthy portions.   The emphasis is on diet quality:     Load up on vegetables and fruits - one-half of your plate: Aim for color  and variety, and remember that potatoes dont count.   Go for whole grains - one-quarter of your plate: Whole wheat, barley, wheat berries, quinoa, oats, brown rice, and foods made with them. If you want pasta, go with whole wheat pasta.   Protein power - one-quarter of your plate: Fish, chicken, beans, and nuts are all healthy, versatile protein sources. Limit red meat.   The diet, however, does go beyond the plate, offering a few other suggestions.   Use healthy plant oils, such as olive, canola, soy, corn, sunflower and peanut. Check the labels, and avoid partially hydrogenated oil, which have unhealthy trans fats.   If youre thirsty, drink water. Coffee and tea are good in moderation, but skip sugary drinks and limit milk and dairy products to one or two daily servings.   The type of carbohydrate in the diet is more important than the amount. Some sources of carbohydrates, such as vegetables, fruits, whole grains, and beans-are healthier than others.   Finally, stay active  Signed, Berniece Salines, DO  06/22/2020 4:07 PM    [facility]

## 2020-06-22 NOTE — Addendum Note (Signed)
Addended by: Mendel Ryder on: 06/22/2020 04:15 PM   Modules accepted: Orders

## 2020-06-22 NOTE — Patient Instructions (Signed)
Medication Instructions:  Your physician recommends that you continue on your current medications as directed. Please refer to the Current Medication list given to you today.  *If you need a refill on your cardiac medications before your next appointment, please call your pharmacy*   Lab Work: Bmp, Mag- Today   If you have labs (blood work) drawn today and your tests are completely normal, you will receive your results only by: Marland Kitchen MyChart Message (if you have MyChart) OR . A paper copy in the mail If you have any lab test that is abnormal or we need to change your treatment, we will call you to review the results.   Testing/Procedures: None ordered    Follow-Up: At [facility], you and your health needs are our priority.  As part of our continuing mission to provide you with exceptional heart care, we have created designated Provider Care Teams.  These Care Teams include your primary Cardiologist (physician) and Advanced Practice Providers (APPs -  Physician Assistants and Nurse Practitioners) who all work together to provide you with the care you need, when you need it.  We recommend signing up for the patient portal called "MyChart".  Sign up information is provided on this After Visit Summary.  MyChart is used to connect with patients for Virtual Visits (Telemedicine).  Patients are able to view lab/test results, encounter notes, upcoming appointments, etc.  Non-urgent messages can be sent to your provider as well.   To learn more about what you can do with MyChart, go to [email or web address].    Your next appointment:   8 week(s)  The format for your next appointment:   In Person  Provider:   Shirlee More, MD   Other Instructions None

## 2020-06-23 ENCOUNTER — Telehealth: Payer: Self-pay

## 2020-06-23 DIAGNOSIS — I10 Essential (primary) hypertension: Secondary | ICD-10-CM

## 2020-06-23 LAB — BASIC METABOLIC PANEL
BUN/Creatinine Ratio: 25 — ABNORMAL HIGH (ref 10–24)
BUN: 73 mg/dL — ABNORMAL HIGH (ref 10–36)
CO2: 33 mmol/L — ABNORMAL HIGH (ref 20–29)
Calcium: 8.8 mg/dL (ref 8.6–10.2)
Chloride: 91 mmol/L — ABNORMAL LOW (ref 96–106)
Creatinine, Ser: 2.87 mg/dL — ABNORMAL HIGH (ref 0.76–1.27)
GFR calc Af Amer: 20 mL/min/{1.73_m2} — ABNORMAL LOW (ref >59)
GFR calc non Af Amer: 18 mL/min/{1.73_m2} — ABNORMAL LOW (ref >59)
Glucose: 133 mg/dL — ABNORMAL HIGH (ref 65–99)
Potassium: 3.5 mmol/L (ref 3.5–5.2)
Sodium: 139 mmol/L (ref 134–144)

## 2020-06-23 LAB — MAGNESIUM: Magnesium: 2.8 mg/dL — ABNORMAL HIGH (ref 1.6–2.3)

## 2020-06-23 NOTE — Telephone Encounter (Signed)
Spoke with patient regarding results and recommendation.  Patient verbalizes understanding and is agreeable to plan of care. Advised patient to call back with any issues or concerns.  

## 2020-06-23 NOTE — Telephone Encounter (Signed)
-----   Message from Richardo Priest, MD sent at 06/23/2020  9:32 AM EDT ----- Not unexpected that his kidney function is worsened after the increase in diuretics, I would continue what he is on and recheck his BMP in 1 week which should give [place] a good baseline of where he sat after this visit

## 2020-06-29 ENCOUNTER — Telehealth: Payer: Self-pay

## 2020-06-29 ENCOUNTER — Other Ambulatory Visit: Payer: Self-pay

## 2020-06-29 DIAGNOSIS — I10 Essential (primary) hypertension: Secondary | ICD-10-CM | POA: Diagnosis not present

## 2020-06-29 NOTE — Telephone Encounter (Signed)
Patient's wife says he is running out of Bumetanide and cannot refill until November. Asks if Dr. Harriet Masson was writing a new perscription.

## 2020-06-29 NOTE — Telephone Encounter (Signed)
Please send a new order of his Bumex as it is written in the chart.

## 2020-06-30 LAB — BASIC METABOLIC PANEL
BUN/Creatinine Ratio: 24 (ref 10–24)
BUN: 65 mg/dL — ABNORMAL HIGH (ref 10–36)
CO2: 29 mmol/L (ref 20–29)
Calcium: 8.9 mg/dL (ref 8.6–10.2)
Chloride: 95 mmol/L — ABNORMAL LOW (ref 96–106)
Creatinine, Ser: 2.69 mg/dL — ABNORMAL HIGH (ref 0.76–1.27)
GFR calc Af Amer: 22 mL/min/{1.73_m2} — ABNORMAL LOW (ref >59)
GFR calc non Af Amer: 19 mL/min/{1.73_m2} — ABNORMAL LOW (ref >59)
Glucose: 120 mg/dL — ABNORMAL HIGH (ref 65–99)
Potassium: 4.1 mmol/L (ref 3.5–5.2)
Sodium: 139 mmol/L (ref 134–144)

## 2020-07-03 MED ORDER — BUMETANIDE 1 MG PO TABS: ORAL_TABLET | ORAL | 3 refills | Status: DC

## 2020-07-03 NOTE — Addendum Note (Signed)
Addended by: Mendel Ryder on: 07/03/2020 09:36 AM   Modules accepted: Orders

## 2020-07-10 ENCOUNTER — Telehealth: Payer: Self-pay | Admitting: Cardiology

## 2020-07-10 MED ORDER — BUMETANIDE 1 MG PO TABS: ORAL_TABLET | ORAL | 3 refills | Status: DC

## 2020-07-10 NOTE — Telephone Encounter (Signed)
Pt c/o medication issue:  1. Name of Medication: bumetanide (BUMEX) 1 MG tablet  2. How are you currently taking this medication (dosage and times per day)? 3 tablets daily   3. Are you having a reaction (difficulty breathing--STAT)? No   4. What is your medication issue? [name] is calling stating since Harry Klein's prescription was increased he will be running out of this medication by Thursday, but the pharmacy states insurance will not cover it to be filled until Sunday.  Please advise.

## 2020-07-10 NOTE — Telephone Encounter (Signed)
Refill sent in again per request 

## 2020-07-12 ENCOUNTER — Telehealth: Payer: Self-pay | Admitting: Cardiology

## 2020-07-12 NOTE — Telephone Encounter (Signed)
Pt c/o medication issue:  1. Name of Medication: bumetanide (BUMEX) 1 MG tablet  2. How are you currently taking this medication (dosage and times per day)? Patient is taking 3 tablets daily  3. Are you having a reaction (difficulty breathing--STAT)? No   4. What is your medication issue?  Patient's daughter is following up. She states pharmacy still will not cover this medication due to inaccurate medication instructions. Please assist.

## 2020-07-12 NOTE — Telephone Encounter (Signed)
Spoke to the pharmacy just now and discussed this medication issue with them. The pharmacist and I went over the medication instructions together and he verbalized understanding of the instructions. He states that the medication will be ready in just a couple hours for the patient.   I called Mr. Dolinger daughter back and left her a message letting her know that this issue should be fixed at this time. I also gave our call back number for her to call back if she had any questions or concerns.

## 2020-07-13 DIAGNOSIS — N184 Chronic kidney disease, stage 4 (severe): Secondary | ICD-10-CM | POA: Diagnosis not present

## 2020-07-19 DIAGNOSIS — N184 Chronic kidney disease, stage 4 (severe): Secondary | ICD-10-CM | POA: Diagnosis not present

## 2020-07-19 DIAGNOSIS — N2581 Secondary hyperparathyroidism of renal origin: Secondary | ICD-10-CM | POA: Diagnosis not present

## 2020-07-19 DIAGNOSIS — D638 Anemia in other chronic diseases classified elsewhere: Secondary | ICD-10-CM | POA: Diagnosis not present

## 2020-07-19 DIAGNOSIS — I129 Hypertensive chronic kidney disease with stage 1 through stage 4 chronic kidney disease, or unspecified chronic kidney disease: Secondary | ICD-10-CM | POA: Diagnosis not present

## 2020-07-20 DIAGNOSIS — Z23 Encounter for immunization: Secondary | ICD-10-CM | POA: Diagnosis not present

## 2020-07-28 DIAGNOSIS — N184 Chronic kidney disease, stage 4 (severe): Secondary | ICD-10-CM | POA: Diagnosis not present

## 2020-08-04 ENCOUNTER — Encounter: Payer: Self-pay | Admitting: Cardiology

## 2020-08-04 ENCOUNTER — Encounter: Payer: Self-pay | Admitting: *Deleted

## 2020-08-16 DIAGNOSIS — N184 Chronic kidney disease, stage 4 (severe): Secondary | ICD-10-CM | POA: Diagnosis not present

## 2020-08-17 ENCOUNTER — Other Ambulatory Visit: Payer: Self-pay

## 2020-08-17 ENCOUNTER — Encounter: Payer: Self-pay | Admitting: Cardiology

## 2020-08-17 ENCOUNTER — Ambulatory Visit (INDEPENDENT_AMBULATORY_CARE_PROVIDER_SITE_OTHER): Payer: Medicare Other | Admitting: Cardiology

## 2020-08-17 VITALS — BP 122/55 | HR 58 | Ht 70.0 in | Wt 180.0 lb

## 2020-08-17 DIAGNOSIS — I2510 Atherosclerotic heart disease of native coronary artery without angina pectoris: Secondary | ICD-10-CM

## 2020-08-17 DIAGNOSIS — I350 Nonrheumatic aortic (valve) stenosis: Secondary | ICD-10-CM | POA: Diagnosis not present

## 2020-08-17 DIAGNOSIS — N184 Chronic kidney disease, stage 4 (severe): Secondary | ICD-10-CM

## 2020-08-17 DIAGNOSIS — I110 Hypertensive heart disease with heart failure: Secondary | ICD-10-CM | POA: Diagnosis not present

## 2020-08-17 NOTE — Progress Notes (Signed)
Cardiology Office Note:    Date:  08/17/2020   ID:  Harry Klein, DOB [birth year], MRN [id number]  PCP:  Angelina Sheriff, MD  Cardiologist:  Shirlee More, MD    Referring MD: Angelina Sheriff, MD    ASSESSMENT:    1. Hypertensive heart disease with heart failure ([place])   2. Nonrheumatic aortic valve stenosis   3. Chronic kidney disease, stage IV (severe) ([place])   4. CAD in native artery    PLAN:    In order of problems listed above:  1. He is presently compensated on a regimen of high-dose loop diuretic and metolazone to manage his volume overload from heart failure.  Closely followed by nephrology I think at this time they are managing his diuretics.  BP is at target taking vasodilator hydralazine and isosorbide along with low-dose beta-blocker. 2. Clinically has severe aortic stenosis of heart failure I would not advise consideration of TAVR 3. Severe CKD he follows with nephrology and so far were able to continuously balance his CKD and heart failure 4. Stable CKD continue medical therapy including his statin last lipids were at target 12/17/2019 cholesterol 114 LDL 52 triglycerides 82 HDL 46   Next appointment: 3 months   Medication Adjustments/Labs and Tests Ordered: Current medicines are reviewed at length with the patient today.  Concerns regarding medicines are outlined above.  No orders of the defined types were placed in this encounter.  No orders of the defined types were placed in this encounter.   Chief Complaint  Patient presents with  . Follow-up  . Congestive Heart Failure  . Chronic Kidney Disease    History of Present Illness:    Harry Klein is a 86 y.o. male with a hx of coronary artery disease CABG heart failure hypertension aortic stenosis chronic kidney disease and hyper lipidemia last seen 06/22/2020 by my partner Dr. Harriet Masson.  He follows with [facility]. Compliance with diet, lifestyle and medications: Yes  Is supervised  by his family weights have been stable compliant with medications They are concerned as he is increasingly unsteady uses a walker He is seeing nephrology last month again next month to try to manage his severe CKD and heart failure I do not have access to their labs. He is not having shortness of breath edema chest pain palpitation or syncope  His last echocardiogram performed 06/30/2019 showed mild LVH EF 55 to 60% he had RV dysfunction and moderate to severe aortic stenosis with a mean gradient 22 mmHg and a VTI ratio of 0.27  Recent creatinine 07/28/2020 2.72, lipid profile cholesterol 114 LDL 52 triglycerides 82 HDL 46 Past Medical History:  Diagnosis Date  . Abdominal aortic aneurysm (AAA) ([place])   . Acute on chronic diastolic congestive heart failure ([place]) 04/13/2020  . Anemia of chronic disease   . Aortic stenosis 03/08/2016   Overview:  Mild  . BPH (benign prostatic hyperplasia) 04/13/2014  . CAD in native artery 05/08/2018  . Cardiac volume overload 04/13/2020  . Cardiomyopathy ([place]) 07/10/2015   Overview:  Ejection fraction 40% in 2015  . CHF (congestive heart failure) ([place])   . CHF exacerbation ([place]) 06/15/2020  . Chronic kidney disease, stage IV (severe) ([place]) 03/08/2016  . Coronary artery disease   . Degenerative disc disease, cervical 06/15/2020  . Dyslipidemia 07/10/2015  . Endoleak post (EVAR) endovascular aneurysm repair, subsequent encounter 11/16/2015  . Essential hypertension 06/15/2020  . GERD (gastroesophageal reflux disease) 06/15/2020  . Hyperlipidemia   .  Hypertension   . Hypertensive heart disease with heart failure ([place]) 02/22/2016  . Ischemic cardiomyopathy 07/10/2015   Overview:  Ejection fraction 40% in 2015  . LV dysfunction 07/15/2017   Overview:  Last EF 40%  . Nocturia 04/13/2014  . Nonrheumatic aortic valve stenosis 03/08/2016   Overview:  Mild  . Presence of aortocoronary bypass graft 07/10/2015   Overview:  Done in 2002  . Secondary hyperparathyroidism ([place])   . Urge  incontinence 04/13/2014  . Weakness of both legs 06/15/2020    Past Surgical History:  Procedure Laterality Date  . ARTERIAL ANEURYSM REPAIR  2012  . BACK SURGERY  2006  . CARDIAC CATHETERIZATION    . CORONARY ARTERY BYPASS GRAFT  2002  . REPLACEMENT TOTAL KNEE Left 2009  . REPLACEMENT TOTAL KNEE BILATERAL  2000    Current Medications: Current Meds  Medication Sig  . bumetanide (BUMEX) 1 MG tablet Take 1 mg by mouth. Take 2 tablets am and 1 tablet pm  . clopidogrel (PLAVIX) 75 MG tablet Take 1 tablet (75 mg total) by mouth daily.  . Cyanocobalamin (B-12 COMPLIANCE INJECTION IJ) Inject as directed every 30 (thirty) days.  . famotidine (PEPCID) 20 MG tablet Take 20 mg by mouth 2 (two) times daily.  . finasteride (PROSCAR) 5 MG tablet Take 1 tablet by mouth every other day.  . hydrALAZINE (APRESOLINE) 50 MG tablet TAKE 1 TABLET BY MOUTH THREE TIMES A DAY  . isosorbide dinitrate (ISORDIL) 20 MG tablet TAKE 1 TABLET BY MOUTH THREE TIMES A DAY  . metolazone (ZAROXOLYN) 2.5 MG tablet Take 2.5 mg by mouth 2 (two) times a week.  . metoprolol succinate (TOPROL-XL) 25 MG 24 hr tablet Take 1 tablet (25 mg total) by mouth daily.  . Multiple Vitamins-Minerals (PRESERVISION AREDS 2 PO) Take 2 tablets by mouth daily.   . polyethylene glycol powder (GLYCOLAX/MIRALAX) powder Take 17 g by mouth daily.  . potassium chloride (MICRO-K) 10 MEQ CR capsule Take 10 mEq by mouth daily.  . pravastatin (PRAVACHOL) 80 MG tablet Take 1 tablet (80 mg total) by mouth daily.  Marland Kitchen pyridOXINE (VITAMIN B-6) 100 MG tablet Take 100 mg by mouth daily.  . silodosin (RAPAFLO) 8 MG CAPS capsule Take 1 capsule by mouth at bedtime.   . Vitamin D, Ergocalciferol, (DRISDOL) 50000 units CAPS capsule Take 50,000 Units by mouth once a week.     Allergies:   Mobic [meloxicam]   Social History   Socioeconomic History  . Marital status: Married    Spouse name: Not on file  . Number of children: Not on file  . Years of education:  Not on file  . Highest education level: Not on file  Occupational History  . Not on file  Tobacco Use  . Smoking status: Former Smoker    Types: Cigarettes  . Smokeless tobacco: Never Used  Vaping Use  . Vaping Use: Never used  Substance and Sexual Activity  . Alcohol use: Never  . Drug use: Never  . Sexual activity: Not Currently  Other Topics Concern  . Not on file  Social History Narrative  . Not on file   Social Determinants of Health   Financial Resource Strain:   . Difficulty of Paying Living Expenses: Not on file  Food Insecurity:   . Worried About [employer] in the Last Year: Not on file  . Ran Out of Food in the Last Year: Not on file  Transportation Needs:   . Lack of  Transportation (Medical): Not on file  . Lack of Transportation (Non-Medical): Not on file  Physical Activity:   . Days of Exercise per Week: Not on file  . Minutes of Exercise per Session: Not on file  Stress:   . Feeling of Stress : Not on file  Social Connections:   . Frequency of Communication with Friends and Family: Not on file  . Frequency of Social Gatherings with Friends and Family: Not on file  . Attends Religious Services: Not on file  . Active Member of Clubs or Organizations: Not on file  . Attends Archivist Meetings: Not on file  . Marital Status: Not on file     Family History: The patient's family history includes Heart Problems in his father; Heart failure in his mother. There is no history of Heart attack, Heart disease, Stroke, Coronary artery disease, or Congestive Heart Failure. ROS:   Please see the history of present illness.    All other systems reviewed and are negative.  EKGs/Labs/Other Studies Reviewed:    The following studies were reviewed today:    Recent Labs: 12/17/2019: ALT 6 06/14/2020: NT-Pro BNP 8,033 06/22/2020: Magnesium 2.8 06/29/2020: BUN 65; Creatinine, Ser 2.69; Potassium 4.1; Sodium 139  Recent Lipid Panel    Component Value  Date/Time   CHOL 114 12/17/2019 0940   TRIG 82 12/17/2019 0940   HDL 46 12/17/2019 0940   CHOLHDL 2.5 12/17/2019 0940   LDLCALC 52 12/17/2019 0940    Physical Exam:    VS:  BP (!) 122/55   Pulse (!) 58   Ht 5\' 10"  (1.778 m)   Wt 180 lb (81.6 kg)   SpO2 97%   BMI 25.83 kg/m     Wt Readings from Last 3 Encounters:  08/17/20 180 lb (81.6 kg)  10/25/19 186 lb (84.4 kg)  06/22/20 186 lb (84.4 kg)     GEN: He looks quite frail well nourished, well developed in no acute distress HEENT: Normal NECK: No JVD; No carotid bruits LYMPHATICS: No lymphadenopathy CARDIAC: 3/6 murmur AMS encompasses S2 does not radiate to the carotids RRR, no murmurs, rubs, gallops RESPIRATORY:  Clear to auscultation without rales, wheezing or rhonchi  ABDOMEN: Soft, non-tender, non-distended MUSCULOSKELETAL:  No edema; No deformity  SKIN: Warm and dry NEUROLOGIC:  Alert and oriented x 3 PSYCHIATRIC:  Normal affect    Signed, Shirlee More, MD  08/17/2020 1:49 PM    [facility]

## 2020-08-17 NOTE — Patient Instructions (Signed)

## 2020-08-25 DIAGNOSIS — N189 Chronic kidney disease, unspecified: Secondary | ICD-10-CM | POA: Diagnosis not present

## 2020-08-25 DIAGNOSIS — N179 Acute kidney failure, unspecified: Secondary | ICD-10-CM | POA: Diagnosis not present

## 2020-08-25 DIAGNOSIS — I10 Essential (primary) hypertension: Secondary | ICD-10-CM | POA: Diagnosis not present

## 2020-08-25 DIAGNOSIS — N2581 Secondary hyperparathyroidism of renal origin: Secondary | ICD-10-CM | POA: Diagnosis not present

## 2020-08-25 DIAGNOSIS — D638 Anemia in other chronic diseases classified elsewhere: Secondary | ICD-10-CM | POA: Diagnosis not present

## 2020-08-30 DIAGNOSIS — N179 Acute kidney failure, unspecified: Secondary | ICD-10-CM | POA: Diagnosis not present

## 2020-09-04 DIAGNOSIS — N189 Chronic kidney disease, unspecified: Secondary | ICD-10-CM | POA: Diagnosis not present

## 2020-09-04 DIAGNOSIS — M8580 Other specified disorders of bone density and structure, unspecified site: Secondary | ICD-10-CM | POA: Diagnosis not present

## 2020-09-04 DIAGNOSIS — Z0000 Encounter for general adult medical examination without abnormal findings: Secondary | ICD-10-CM | POA: Diagnosis not present

## 2020-09-04 DIAGNOSIS — Z6825 Body mass index (BMI) 25.0-25.9, adult: Secondary | ICD-10-CM | POA: Diagnosis not present

## 2020-09-20 DIAGNOSIS — N179 Acute kidney failure, unspecified: Secondary | ICD-10-CM | POA: Diagnosis not present

## 2020-09-23 ENCOUNTER — Other Ambulatory Visit: Payer: Self-pay | Admitting: Cardiology

## 2020-09-25 NOTE — Telephone Encounter (Signed)
Rx refill sent to pharmacy. 

## 2020-10-08 ENCOUNTER — Other Ambulatory Visit: Payer: Self-pay | Admitting: Cardiology

## 2020-10-14 DIAGNOSIS — D631 Anemia in chronic kidney disease: Secondary | ICD-10-CM | POA: Diagnosis present

## 2020-10-14 DIAGNOSIS — I252 Old myocardial infarction: Secondary | ICD-10-CM | POA: Diagnosis not present

## 2020-10-14 DIAGNOSIS — I2510 Atherosclerotic heart disease of native coronary artery without angina pectoris: Secondary | ICD-10-CM | POA: Diagnosis present

## 2020-10-14 DIAGNOSIS — D649 Anemia, unspecified: Secondary | ICD-10-CM | POA: Diagnosis not present

## 2020-10-14 DIAGNOSIS — E7800 Pure hypercholesterolemia, unspecified: Secondary | ICD-10-CM | POA: Diagnosis present

## 2020-10-14 DIAGNOSIS — R279 Unspecified lack of coordination: Secondary | ICD-10-CM | POA: Diagnosis not present

## 2020-10-14 DIAGNOSIS — R0902 Hypoxemia: Secondary | ICD-10-CM | POA: Diagnosis not present

## 2020-10-14 DIAGNOSIS — Z79899 Other long term (current) drug therapy: Secondary | ICD-10-CM | POA: Diagnosis not present

## 2020-10-14 DIAGNOSIS — I130 Hypertensive heart and chronic kidney disease with heart failure and stage 1 through stage 4 chronic kidney disease, or unspecified chronic kidney disease: Secondary | ICD-10-CM | POA: Diagnosis present

## 2020-10-14 DIAGNOSIS — R278 Other lack of coordination: Secondary | ICD-10-CM | POA: Diagnosis not present

## 2020-10-14 DIAGNOSIS — M6281 Muscle weakness (generalized): Secondary | ICD-10-CM | POA: Diagnosis not present

## 2020-10-14 DIAGNOSIS — N179 Acute kidney failure, unspecified: Secondary | ICD-10-CM | POA: Diagnosis present

## 2020-10-14 DIAGNOSIS — R0602 Shortness of breath: Secondary | ICD-10-CM | POA: Diagnosis not present

## 2020-10-14 DIAGNOSIS — J9601 Acute respiratory failure with hypoxia: Secondary | ICD-10-CM | POA: Diagnosis present

## 2020-10-14 DIAGNOSIS — Z743 Need for continuous supervision: Secondary | ICD-10-CM | POA: Diagnosis not present

## 2020-10-14 DIAGNOSIS — K219 Gastro-esophageal reflux disease without esophagitis: Secondary | ICD-10-CM | POA: Diagnosis present

## 2020-10-14 DIAGNOSIS — I5043 Acute on chronic combined systolic (congestive) and diastolic (congestive) heart failure: Secondary | ICD-10-CM | POA: Diagnosis present

## 2020-10-14 DIAGNOSIS — J90 Pleural effusion, not elsewhere classified: Secondary | ICD-10-CM | POA: Diagnosis not present

## 2020-10-14 DIAGNOSIS — I509 Heart failure, unspecified: Secondary | ICD-10-CM | POA: Diagnosis not present

## 2020-10-14 DIAGNOSIS — N184 Chronic kidney disease, stage 4 (severe): Secondary | ICD-10-CM | POA: Diagnosis present

## 2020-10-14 DIAGNOSIS — U071 COVID-19: Secondary | ICD-10-CM | POA: Diagnosis not present

## 2020-10-14 DIAGNOSIS — I10 Essential (primary) hypertension: Secondary | ICD-10-CM | POA: Diagnosis not present

## 2020-10-14 DIAGNOSIS — G9349 Other encephalopathy: Secondary | ICD-10-CM | POA: Diagnosis present

## 2020-10-14 DIAGNOSIS — Z7902 Long term (current) use of antithrombotics/antiplatelets: Secondary | ICD-10-CM | POA: Diagnosis not present

## 2020-10-14 DIAGNOSIS — I110 Hypertensive heart disease with heart failure: Secondary | ICD-10-CM | POA: Diagnosis not present

## 2020-10-14 DIAGNOSIS — J9811 Atelectasis: Secondary | ICD-10-CM | POA: Diagnosis not present

## 2020-10-14 DIAGNOSIS — I517 Cardiomegaly: Secondary | ICD-10-CM | POA: Diagnosis not present

## 2020-10-14 DIAGNOSIS — Z951 Presence of aortocoronary bypass graft: Secondary | ICD-10-CM | POA: Diagnosis not present

## 2020-10-14 DIAGNOSIS — J1282 Pneumonia due to coronavirus disease 2019: Secondary | ICD-10-CM | POA: Diagnosis present

## 2020-10-14 DIAGNOSIS — R2689 Other abnormalities of gait and mobility: Secondary | ICD-10-CM | POA: Diagnosis not present

## 2020-10-14 DIAGNOSIS — I350 Nonrheumatic aortic (valve) stenosis: Secondary | ICD-10-CM | POA: Diagnosis present

## 2020-10-14 DIAGNOSIS — N400 Enlarged prostate without lower urinary tract symptoms: Secondary | ICD-10-CM | POA: Diagnosis present

## 2020-10-14 DIAGNOSIS — E785 Hyperlipidemia, unspecified: Secondary | ICD-10-CM | POA: Diagnosis present

## 2020-10-14 DIAGNOSIS — Z66 Do not resuscitate: Secondary | ICD-10-CM | POA: Diagnosis present

## 2020-10-14 DIAGNOSIS — J9600 Acute respiratory failure, unspecified whether with hypoxia or hypercapnia: Secondary | ICD-10-CM | POA: Diagnosis not present

## 2020-10-14 DIAGNOSIS — E876 Hypokalemia: Secondary | ICD-10-CM | POA: Diagnosis present

## 2020-10-18 DIAGNOSIS — I132 Hypertensive heart and chronic kidney disease with heart failure and with stage 5 chronic kidney disease, or end stage renal disease: Secondary | ICD-10-CM | POA: Diagnosis not present

## 2020-10-18 DIAGNOSIS — I2510 Atherosclerotic heart disease of native coronary artery without angina pectoris: Secondary | ICD-10-CM | POA: Diagnosis not present

## 2020-10-18 DIAGNOSIS — M6281 Muscle weakness (generalized): Secondary | ICD-10-CM | POA: Diagnosis not present

## 2020-10-18 DIAGNOSIS — K219 Gastro-esophageal reflux disease without esophagitis: Secondary | ICD-10-CM | POA: Diagnosis not present

## 2020-10-18 DIAGNOSIS — I5043 Acute on chronic combined systolic (congestive) and diastolic (congestive) heart failure: Secondary | ICD-10-CM | POA: Diagnosis not present

## 2020-10-18 DIAGNOSIS — Z8616 Personal history of COVID-19: Secondary | ICD-10-CM | POA: Diagnosis not present

## 2020-10-18 DIAGNOSIS — N186 End stage renal disease: Secondary | ICD-10-CM | POA: Diagnosis not present

## 2020-10-18 DIAGNOSIS — I5042 Chronic combined systolic (congestive) and diastolic (congestive) heart failure: Secondary | ICD-10-CM | POA: Diagnosis not present

## 2020-10-18 DIAGNOSIS — R278 Other lack of coordination: Secondary | ICD-10-CM | POA: Diagnosis not present

## 2020-10-18 DIAGNOSIS — J9600 Acute respiratory failure, unspecified whether with hypoxia or hypercapnia: Secondary | ICD-10-CM | POA: Diagnosis not present

## 2020-10-18 DIAGNOSIS — I10 Essential (primary) hypertension: Secondary | ICD-10-CM | POA: Diagnosis not present

## 2020-10-18 DIAGNOSIS — N179 Acute kidney failure, unspecified: Secondary | ICD-10-CM | POA: Diagnosis not present

## 2020-10-18 DIAGNOSIS — Z515 Encounter for palliative care: Secondary | ICD-10-CM | POA: Diagnosis not present

## 2020-10-18 DIAGNOSIS — J9601 Acute respiratory failure with hypoxia: Secondary | ICD-10-CM | POA: Diagnosis not present

## 2020-10-18 DIAGNOSIS — I350 Nonrheumatic aortic (valve) stenosis: Secondary | ICD-10-CM | POA: Diagnosis not present

## 2020-10-18 DIAGNOSIS — Z743 Need for continuous supervision: Secondary | ICD-10-CM | POA: Diagnosis not present

## 2020-10-18 DIAGNOSIS — I509 Heart failure, unspecified: Secondary | ICD-10-CM | POA: Diagnosis not present

## 2020-10-18 DIAGNOSIS — R279 Unspecified lack of coordination: Secondary | ICD-10-CM | POA: Diagnosis not present

## 2020-10-18 DIAGNOSIS — R2689 Other abnormalities of gait and mobility: Secondary | ICD-10-CM | POA: Diagnosis not present

## 2020-10-18 DIAGNOSIS — J189 Pneumonia, unspecified organism: Secondary | ICD-10-CM | POA: Diagnosis not present

## 2020-10-18 DIAGNOSIS — N184 Chronic kidney disease, stage 4 (severe): Secondary | ICD-10-CM | POA: Diagnosis not present

## 2020-10-18 DIAGNOSIS — R0902 Hypoxemia: Secondary | ICD-10-CM | POA: Diagnosis not present

## 2020-10-18 DIAGNOSIS — J811 Chronic pulmonary edema: Secondary | ICD-10-CM | POA: Diagnosis not present

## 2020-10-18 DIAGNOSIS — U071 COVID-19: Secondary | ICD-10-CM | POA: Diagnosis not present

## 2020-10-18 DIAGNOSIS — D649 Anemia, unspecified: Secondary | ICD-10-CM | POA: Diagnosis not present

## 2020-10-18 DIAGNOSIS — R262 Difficulty in walking, not elsewhere classified: Secondary | ICD-10-CM | POA: Diagnosis not present

## 2020-10-25 DIAGNOSIS — D649 Anemia, unspecified: Secondary | ICD-10-CM | POA: Diagnosis not present

## 2020-10-25 DIAGNOSIS — R262 Difficulty in walking, not elsewhere classified: Secondary | ICD-10-CM | POA: Diagnosis not present

## 2020-10-25 DIAGNOSIS — I509 Heart failure, unspecified: Secondary | ICD-10-CM | POA: Diagnosis not present

## 2020-10-25 DIAGNOSIS — N184 Chronic kidney disease, stage 4 (severe): Secondary | ICD-10-CM | POA: Diagnosis not present

## 2020-11-02 DIAGNOSIS — I5042 Chronic combined systolic (congestive) and diastolic (congestive) heart failure: Secondary | ICD-10-CM | POA: Diagnosis not present

## 2020-11-02 DIAGNOSIS — I132 Hypertensive heart and chronic kidney disease with heart failure and with stage 5 chronic kidney disease, or end stage renal disease: Secondary | ICD-10-CM | POA: Diagnosis not present

## 2020-11-02 DIAGNOSIS — N186 End stage renal disease: Secondary | ICD-10-CM | POA: Diagnosis not present

## 2020-11-02 DIAGNOSIS — Z8616 Personal history of COVID-19: Secondary | ICD-10-CM | POA: Diagnosis not present

## 2020-11-02 DIAGNOSIS — Z515 Encounter for palliative care: Secondary | ICD-10-CM | POA: Diagnosis not present

## 2020-11-02 DIAGNOSIS — I2510 Atherosclerotic heart disease of native coronary artery without angina pectoris: Secondary | ICD-10-CM | POA: Diagnosis not present

## 2020-11-02 DIAGNOSIS — D649 Anemia, unspecified: Secondary | ICD-10-CM | POA: Diagnosis not present

## 2020-11-04 DIAGNOSIS — I5022 Chronic systolic (congestive) heart failure: Secondary | ICD-10-CM | POA: Diagnosis not present

## 2020-11-04 DIAGNOSIS — I252 Old myocardial infarction: Secondary | ICD-10-CM | POA: Diagnosis not present

## 2020-11-04 DIAGNOSIS — N184 Chronic kidney disease, stage 4 (severe): Secondary | ICD-10-CM | POA: Diagnosis not present

## 2020-11-04 DIAGNOSIS — I2510 Atherosclerotic heart disease of native coronary artery without angina pectoris: Secondary | ICD-10-CM | POA: Diagnosis not present

## 2020-11-04 DIAGNOSIS — N400 Enlarged prostate without lower urinary tract symptoms: Secondary | ICD-10-CM | POA: Diagnosis not present

## 2020-11-04 DIAGNOSIS — K219 Gastro-esophageal reflux disease without esophagitis: Secondary | ICD-10-CM | POA: Diagnosis not present

## 2020-11-04 DIAGNOSIS — E7800 Pure hypercholesterolemia, unspecified: Secondary | ICD-10-CM | POA: Diagnosis not present

## 2020-11-04 DIAGNOSIS — D649 Anemia, unspecified: Secondary | ICD-10-CM | POA: Diagnosis not present

## 2020-11-04 DIAGNOSIS — I130 Hypertensive heart and chronic kidney disease with heart failure and stage 1 through stage 4 chronic kidney disease, or unspecified chronic kidney disease: Secondary | ICD-10-CM | POA: Diagnosis not present

## 2020-11-04 DIAGNOSIS — Z951 Presence of aortocoronary bypass graft: Secondary | ICD-10-CM | POA: Diagnosis not present

## 2020-11-05 DIAGNOSIS — R4182 Altered mental status, unspecified: Secondary | ICD-10-CM | POA: Diagnosis not present

## 2020-11-05 DIAGNOSIS — I2510 Atherosclerotic heart disease of native coronary artery without angina pectoris: Secondary | ICD-10-CM | POA: Diagnosis not present

## 2020-11-05 DIAGNOSIS — I5022 Chronic systolic (congestive) heart failure: Secondary | ICD-10-CM | POA: Diagnosis not present

## 2020-11-05 DIAGNOSIS — M2550 Pain in unspecified joint: Secondary | ICD-10-CM | POA: Diagnosis not present

## 2020-11-05 DIAGNOSIS — Z951 Presence of aortocoronary bypass graft: Secondary | ICD-10-CM | POA: Diagnosis not present

## 2020-11-05 DIAGNOSIS — Z7401 Bed confinement status: Secondary | ICD-10-CM | POA: Diagnosis not present

## 2020-11-05 DIAGNOSIS — I252 Old myocardial infarction: Secondary | ICD-10-CM | POA: Diagnosis not present

## 2020-11-05 DIAGNOSIS — N184 Chronic kidney disease, stage 4 (severe): Secondary | ICD-10-CM | POA: Diagnosis not present

## 2020-11-05 DIAGNOSIS — I130 Hypertensive heart and chronic kidney disease with heart failure and stage 1 through stage 4 chronic kidney disease, or unspecified chronic kidney disease: Secondary | ICD-10-CM | POA: Diagnosis not present

## 2020-11-07 DEATH — deceased

## 2020-11-17 ENCOUNTER — Ambulatory Visit: Payer: Medicare Other | Admitting: Cardiology
